# Patient Record
Sex: Female | Born: 1990 | Race: Black or African American | Hispanic: No | Marital: Single | Smoking: Current some day smoker
Health system: Southern US, Community
[De-identification: ages and names within clinical notes are randomized; demographics above are authoritative.]

## PROBLEM LIST (undated history)

## (undated) ENCOUNTER — Inpatient Hospital Stay (HOSPITAL_COMMUNITY): Payer: Self-pay

## (undated) DIAGNOSIS — Z789 Other specified health status: Secondary | ICD-10-CM

## (undated) HISTORY — PX: NO PAST SURGERIES: SHX2092

## (undated) HISTORY — PX: WISDOM TOOTH EXTRACTION: SHX21

---

## 2012-05-09 ENCOUNTER — Emergency Department (HOSPITAL_COMMUNITY)
Admission: EM | Admit: 2012-05-09 | Discharge: 2012-05-09 | Disposition: A | Discharge status: Home / Self Care | Source: Home / Self Care | Attending: Emergency Medicine | Admitting: Emergency Medicine

## 2012-05-09 ENCOUNTER — Encounter (HOSPITAL_COMMUNITY): Payer: Self-pay | Admitting: Emergency Medicine

## 2012-05-09 DIAGNOSIS — L02412 Cutaneous abscess of left axilla: Secondary | ICD-10-CM

## 2012-05-09 DIAGNOSIS — IMO0002 Reserved for concepts with insufficient information to code with codable children: Secondary | ICD-10-CM

## 2012-05-09 MED ORDER — SULFAMETHOXAZOLE-TRIMETHOPRIM 800-160 MG PO TABS: 1.0000 | ORAL_TABLET | Freq: Two times a day (BID) | ORAL | Status: DC

## 2012-05-09 NOTE — ED Provider Notes (Signed)
History     CSN: [id number]  Arrival date & time 05/09/12  1356   None     Chief Complaint  Patient presents with  . Recurrent Skin Infections    (Consider location/radiation/quality/duration/timing/severity/associated sxs/prior treatment) The history is provided by the patient.  This patient presents for evaluation of a cutaneous abscess.  The lesion is located in the left axilla.  Onset was 1 week ago.  Symptoms have worsened.  Abscess has associated symptoms of pain.  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  There is no known previous history of MRSA.   They do not have a history of diabetes. Has applied warm compresses.  Has had I and D of previous abscesses.  History reviewed. No pertinent past medical history.  History reviewed. No pertinent past surgical history.  History reviewed. No pertinent family history.  History  Substance Use Topics  . Smoking status: Not on file  . Smokeless tobacco: Not on file  . Alcohol Use: Not on file    OB History    Grav Para Term Preterm Abortions TAB SAB Ect Mult Living                  Review of Systems  Constitutional: Negative.   Respiratory: Negative.   Cardiovascular: Negative.   Musculoskeletal: Negative.   Skin: Positive for wound.    Allergies  Review of patient's allergies indicates no known allergies.  Home Medications   Current Outpatient Rx  Name Route Sig Dispense Refill  . SULFAMETHOXAZOLE-TRIMETHOPRIM 800-160 MG PO TABS Oral Take 1 tablet by mouth every 12 (twelve) hours. 20 tablet 0    BP 124/83  Pulse 77  Temp 98.1 F (36.7 C) (Oral)  Resp 18  SpO2 100%  LMP 04/19/2012  Physical Exam  Nursing note and vitals reviewed. Constitutional: She is oriented to person, place, and time. Vital signs are normal. She appears well-developed and well-nourished. She is active and cooperative.  HENT:  Head: Normocephalic.  Eyes: Conjunctivae normal are normal. Pupils are  equal, round, and reactive to light. No scleral icterus.  Neck: Trachea normal. Neck supple.  Cardiovascular: Normal rate and regular rhythm.   Pulmonary/Chest: Effort normal and breath sounds normal.  Neurological: She is alert and oriented to person, place, and time. No cranial nerve deficit or sensory deficit.  Skin: Skin is warm and dry. Lesion noted.     Psychiatric: She has a normal mood and affect. Her speech is normal and behavior is normal. Judgment and thought content normal. Cognition and memory are normal.    ED Course  Procedures (including critical care time)  Labs Reviewed - No data to display No results found.   1. Abscess of axilla, left       MDM  Warm compresses, dial soap, womens degree deodorant, shave less with new razors, take antibiotics as prescribed.  rtc in 3 days if symptoms are not improved.        Johnsie Kindred, NP 05/09/12 1458

## 2012-05-09 NOTE — ED Provider Notes (Signed)
Medical screening examination/treatment/procedure(s) were performed by non-physician practitioner and as supervising physician I was immediately available for consultation/collaboration.  Leslee Home, M.D.   Reuben Likes, MD 05/09/12 [phone]

## 2012-05-09 NOTE — ED Notes (Signed)
Pt c/o boil under left arm x 1 wk. It has become hard and very sore to touch.  Recent change in detergent.   Pt denies drainage.

## 2012-05-17 ENCOUNTER — Emergency Department (INDEPENDENT_AMBULATORY_CARE_PROVIDER_SITE_OTHER)
Admission: EM | Admit: 2012-05-17 | Discharge: 2012-05-17 | Disposition: A | Discharge status: Home / Self Care | Source: Home / Self Care

## 2012-05-17 ENCOUNTER — Encounter (HOSPITAL_COMMUNITY): Payer: Self-pay | Admitting: Emergency Medicine

## 2012-05-17 DIAGNOSIS — B373 Candidiasis of vulva and vagina: Secondary | ICD-10-CM

## 2012-05-17 LAB — POCT URINALYSIS DIP (DEVICE)
Nitrite: NEGATIVE
Protein, ur: 30 mg/dL — AB
pH: 6 (ref 5.0–8.0)

## 2012-05-17 LAB — WET PREP, GENITAL: Clue Cells Wet Prep HPF POC: NONE SEEN

## 2012-05-17 MED ORDER — FLUCONAZOLE 150 MG PO TABS: ORAL_TABLET | ORAL | Status: DC

## 2012-05-17 NOTE — ED Provider Notes (Signed)
History     CSN: [id number]  Arrival date & time 05/17/12  1736   None     Chief Complaint  Patient presents with  . Urinary Tract Infection    (Consider location/radiation/quality/duration/timing/severity/associated sxs/prior treatment) HPI Comments: -21year-old female states that she has a UTI. Specific symptoms include vaginal itching, vaginal discharge and vulvovaginal erythema. The symptoms are occurring for 24-48 hours. She was started on antibiotics one week ago to treat and abscess. When questioned the patient denies any urinary symptoms. She denies dysuria, urinary frequency or urgency.  Patient is a 21 y.o. female presenting with urinary tract infection.  Urinary Tract Infection Pertinent negatives include no chest pain and no shortness of breath.    History reviewed. No pertinent past medical history.  History reviewed. No pertinent past surgical history.  History reviewed. No pertinent family history.  History  Substance Use Topics  . Smoking status: Not on file  . Smokeless tobacco: Not on file  . Alcohol Use: Not on file    OB History    Grav Para Term Preterm Abortions TAB SAB Ect Mult Living                  Review of Systems  Constitutional: Negative for fever, activity change and fatigue.  HENT: Negative.   Respiratory: Negative for cough, shortness of breath and wheezing.   Cardiovascular: Negative for chest pain and palpitations.  Gastrointestinal: Negative.   Genitourinary: Positive for vaginal discharge and vaginal pain. Negative for dysuria, frequency, flank pain and pelvic pain.  Musculoskeletal: Negative.   Skin: Negative for color change, pallor and rash.  Neurological: Negative.     Allergies  Review of patient's allergies indicates no known allergies.  Home Medications   Current Outpatient Rx  Name Route Sig Dispense Refill  . FLUCONAZOLE 150 MG PO TABS  1 tab q o d times 3 doses 3 tablet 0  . SULFAMETHOXAZOLE-TRIMETHOPRIM  800-160 MG PO TABS Oral Take 1 tablet by mouth every 12 (twelve) hours. 20 tablet 0    BP 120/80  Pulse 72  Temp 98 F (36.7 C) (Oral)  Resp 18  SpO2 100%  LMP 04/19/2012  Physical Exam  Constitutional: She is oriented to person, place, and time. She appears well-developed and well-nourished. No distress.  HENT:  Head: Normocephalic and atraumatic.  Mouth/Throat: No oropharyngeal exudate.  Eyes: EOM are normal.  Neck: Normal range of motion. Neck supple.  Pulmonary/Chest: Effort normal and breath sounds normal. No respiratory distress.  Abdominal: Soft. There is no tenderness.  Genitourinary: Vaginal discharge found.       Right cottage cheese discharge coating the. vulvovaginal area. Cervix is pink without lesions. Os nonparous, no erythema. Above discharge coating the cervix. Neg CMT  Musculoskeletal: Normal range of motion.  Neurological: She is alert and oriented to person, place, and time. No cranial nerve deficit.  Skin: Skin is warm and dry.  Psychiatric: She has a normal mood and affect.    ED Course  Procedures (including critical care time)  Labs Reviewed  POCT URINALYSIS DIP (DEVICE) - Abnormal; Notable for the following:    Bilirubin Urine SMALL (*)     Ketones, ur TRACE (*)     Hgb urine dipstick TRACE (*)     Protein, ur 30 (*)     Leukocytes, UA SMALL (*)  Biochemical Testing Only. Please order routine urinalysis from main lab if confirmatory testing is needed.   All other components within normal limits  POCT  PREGNANCY, URINE   No results found.   1. Candida vaginitis       MDM  Diflucan 150 mg one every other day x3 doses For vulvovaginal discomfort may try Vagisil topical cold compresses locally GC/chlamydia and wet prep obtained. Results for orders placed during the hospital encounter of 05/17/12  POCT URINALYSIS DIP (DEVICE)      Component Value Range   Glucose, UA NEGATIVE  NEGATIVE mg/dL   Bilirubin Urine SMALL (*) NEGATIVE   Ketones,  ur TRACE (*) NEGATIVE mg/dL   Specific Gravity, Urine >=1.030  1.005 - 1.030   Hgb urine dipstick TRACE (*) NEGATIVE   pH 6.0  5.0 - 8.0   Protein, ur 30 (*) NEGATIVE mg/dL   Urobilinogen, UA 0.2  0.0 - 1.0 mg/dL   Nitrite NEGATIVE  NEGATIVE   Leukocytes, UA SMALL (*) NEGATIVE  POCT PREGNANCY, URINE      Component Value Range   Preg Test, Ur NEGATIVE  NEGATIVE    The small amount of leukocytes are most likely due to contamination of the vaginal discharge \       Hayden Rasmussen, NP 05/17/12 1941  Hayden Rasmussen, NP 05/17/12 1949

## 2012-05-17 NOTE — ED Notes (Signed)
Reports discharge and itching.   Patient states she did have sexual activity and notice afterwards she had redness in the area.

## 2012-05-18 LAB — GC/CHLAMYDIA PROBE AMP, GENITAL: Chlamydia, DNA Probe: NEGATIVE

## 2012-05-18 NOTE — ED Provider Notes (Signed)
Medical screening examination/treatment/procedure(s) were performed by resident physician or non-physician practitioner and as supervising physician I was immediately available for consultation/collaboration.   Barkley Bruns MD.    Linna Hoff, MD 05/18/12 [phone]

## 2012-05-19 NOTE — ED Notes (Signed)
GC/Chlamydia neg., Wet prep: mod. Yeast, mod. WBC's. Pt. adequately treated with Diflucan. Penny Cherry 05/19/2012

## 2015-01-16 ENCOUNTER — Encounter (HOSPITAL_COMMUNITY): Payer: Self-pay | Admitting: *Deleted

## 2015-01-16 ENCOUNTER — Emergency Department (HOSPITAL_COMMUNITY)
Admission: EM | Admit: 2015-01-16 | Discharge: 2015-01-16 | Disposition: A | Discharge status: Home / Self Care | Attending: Emergency Medicine | Admitting: Emergency Medicine

## 2015-01-16 DIAGNOSIS — Z720 Tobacco use: Secondary | ICD-10-CM | POA: Insufficient documentation

## 2015-01-16 DIAGNOSIS — R197 Diarrhea, unspecified: Secondary | ICD-10-CM | POA: Insufficient documentation

## 2015-01-16 DIAGNOSIS — Z3202 Encounter for pregnancy test, result negative: Secondary | ICD-10-CM | POA: Insufficient documentation

## 2015-01-16 DIAGNOSIS — R109 Unspecified abdominal pain: Secondary | ICD-10-CM | POA: Insufficient documentation

## 2015-01-16 LAB — BASIC METABOLIC PANEL
ANION GAP: 10 (ref 5–15)
BUN: 11 mg/dL (ref 6–20)
CALCIUM: 9 mg/dL (ref 8.9–10.3)
CO2: 22 mmol/L (ref 22–32)
Chloride: 103 mmol/L (ref 101–111)
Creatinine, Ser: 1.04 mg/dL — ABNORMAL HIGH (ref 0.44–1.00)
GFR calc Af Amer: >60 mL/min (ref >60)
GFR calc non Af Amer: >60 mL/min (ref >60)
GLUCOSE: 130 mg/dL — AB (ref 65–99)
Potassium: 3.3 mmol/L — ABNORMAL LOW (ref 3.5–5.1)
SODIUM: 135 mmol/L (ref 135–145)

## 2015-01-16 LAB — CBC WITH DIFFERENTIAL/PLATELET
BASOS PCT: 0 % (ref 0–1)
Basophils Absolute: 0 10*3/uL (ref 0.0–0.1)
EOS ABS: 0.1 10*3/uL (ref 0.0–0.7)
EOS PCT: 1 % (ref 0–5)
HCT: 40.8 % (ref 36.0–46.0)
HEMOGLOBIN: 13.9 g/dL (ref 12.0–15.0)
LYMPHS ABS: 0.8 10*3/uL (ref 0.7–4.0)
Lymphocytes Relative: 17 % (ref 12–46)
MCH: 30.6 pg (ref 26.0–34.0)
MCHC: 34.1 g/dL (ref 30.0–36.0)
MCV: 89.9 fL (ref 78.0–100.0)
MONOS PCT: 7 % (ref 3–12)
Monocytes Absolute: 0.4 10*3/uL (ref 0.1–1.0)
Neutro Abs: 3.5 10*3/uL (ref 1.7–7.7)
Neutrophils Relative %: 75 % (ref 43–77)
Platelets: 214 10*3/uL (ref 150–400)
RBC: 4.54 MIL/uL (ref 3.87–5.11)
RDW: 12.3 % (ref 11.5–15.5)
WBC: 4.7 10*3/uL (ref 4.0–10.5)

## 2015-01-16 LAB — I-STAT BETA HCG BLOOD, ED (MC, WL, AP ONLY): I-stat hCG, quantitative: <5 m[IU]/mL (ref <5)

## 2015-01-16 MED ORDER — DICYCLOMINE HCL 20 MG PO TABS: 20.0000 mg | ORAL_TABLET | Freq: Two times a day (BID) | ORAL | Status: DC

## 2015-01-16 MED ORDER — LOPERAMIDE HCL 2 MG PO CAPS
2.0000 mg | ORAL_CAPSULE | Freq: Once | ORAL | Status: AC
  Administered 2015-01-16: 2 mg via ORAL
  Filled 2015-01-16: qty 1

## 2015-01-16 MED ORDER — POTASSIUM CHLORIDE CRYS ER 20 MEQ PO TBCR
20.0000 meq | EXTENDED_RELEASE_TABLET | Freq: Once | ORAL | Status: AC
  Administered 2015-01-16: 20 meq via ORAL
  Filled 2015-01-16: qty 1

## 2015-01-16 MED ORDER — DICYCLOMINE HCL 10 MG PO CAPS
10.0000 mg | ORAL_CAPSULE | Freq: Once | ORAL | Status: AC
  Administered 2015-01-16: 10 mg via ORAL
  Filled 2015-01-16: qty 1

## 2015-01-16 MED ORDER — LOPERAMIDE HCL 2 MG PO CAPS: 2.0000 mg | ORAL_CAPSULE | Freq: Four times a day (QID) | ORAL | Status: DC | PRN

## 2015-01-16 NOTE — ED Notes (Signed)
Pt stated that on Sunday she began to have severe abdominal pain associated with diarrhea. Cold chills and sweats are also presents.

## 2015-01-16 NOTE — ED Notes (Signed)
NAD at this time. Pt is stable and going home.  

## 2015-01-16 NOTE — ED Provider Notes (Signed)
CSN: [id number]     Arrival date & time 01/16/15  [id number] History   First MD Initiated Contact with Patient 01/16/15 [phone]     Chief Complaint  Patient presents with  . Abdominal Pain     (Consider location/radiation/quality/duration/timing/severity/associated sxs/prior Treatment) Patient is a 24 y.o. female presenting with abdominal pain. The history is provided by the patient and medical records.  Abdominal Pain Associated symptoms: diarrhea     This is a 24 year old female with no significant past medical history presenting to the ED for abdominal pain and diarrhea. This started over the weekend and has improved since onset. States diarrhea has been watery, nonbloody. She still does have some mild lower abdominal cramping. No nausea or vomiting. She also reports chills and cold sweats. Denies fever. No history of abdominal surgeries or GI issues. No urinary symptoms or vaginal complaints. Has been able to drink ginger ale so fat today.  No intervention tried prior to arrival.  VSS.  History reviewed. No pertinent past medical history. History reviewed. No pertinent past surgical history. No family history on file. History  Substance Use Topics  . Smoking status: Light Tobacco Smoker  . Smokeless tobacco: Not on file  . Alcohol Use: 0.6 oz/week    1 Shots of liquor per week   OB History    No data available     Review of Systems  Gastrointestinal: Positive for abdominal pain and diarrhea.  All other systems reviewed and are negative.     Allergies  Review of patient's allergies indicates no known allergies.  Home Medications   Prior to Admission medications   Medication Sig Start Date End Date Taking? Authorizing Provider  fluconazole (DIFLUCAN) 150 MG tablet 1 tab q o d times 3 doses 05/17/12   Hayden Rasmussenavid Mabe, NP  sulfamethoxazole-trimethoprim (SEPTRA DS) 800-160 MG per tablet Take 1 tablet by mouth every 12 (twelve) hours. 05/09/12   Johnsie Kindredarmen L Chatten, NP   BP 114/76 mmHg   Pulse 86  Temp(Src) 98.2 F (36.8 C) (Oral)  Resp 18  Ht 5\' 3"  (1.6 m)  Wt 143 lb (64.864 kg)  BMI 25.34 kg/m2  SpO2 99%   Physical Exam  Constitutional: She is oriented to person, place, and time. She appears well-developed and well-nourished.  HENT:  Head: Normocephalic and atraumatic.  Mouth/Throat: Oropharynx is clear and moist.  Mucous membranes remain moist  Eyes: Conjunctivae and EOM are normal. Pupils are equal, round, and reactive to light.  Neck: Normal range of motion.  Cardiovascular: Normal rate, regular rhythm and normal heart sounds.   Pulmonary/Chest: Effort normal and breath sounds normal.  Abdominal: Soft. Bowel sounds are normal. There is no tenderness. There is no rigidity and no guarding.  Abdomen soft, nondistended, no focal tenderness or peritoneal signs  Musculoskeletal: Normal range of motion.  Neurological: She is alert and oriented to person, place, and time.  Skin: Skin is warm and dry.  Psychiatric: She has a normal mood and affect.  Nursing note and vitals reviewed.   ED Course  Procedures (including critical care time) Labs Review Labs Reviewed  BASIC METABOLIC PANEL - Abnormal; Notable for the following:    Potassium 3.3 (*)    Glucose, Bld 130 (*)    Creatinine, Ser 1.04 (*)    All other components within normal limits  CBC WITH DIFFERENTIAL/PLATELET  I-STAT BETA HCG BLOOD, ED ([facility], [facility], [facility] ONLY)    Imaging Review No results found.   EKG Interpretation None  MDM   Final diagnoses:  Abdominal cramping  Diarrhea   24 year old female here with diarrhea for the past 2 days. Patient afebrile, nontoxic. Her abdominal exam is benign. Her mucous membranes remain moist and she does not appear dehydrated. Labwork is reassuring, mild hypokalemia at 3.3 which was replaced here. Patient was given Imodium and Bentyl here in ED, some improvement.  No active diarrhea here.  Patient will be d/c home with supportive care.  Discussed plan with  patient, he/she acknowledged understanding and agreed with plan of care.  Return precautions given for new or worsening symptoms.  Garlon Hatchet, PA-C 01/16/15 1306  Doug Sou, MD 01/16/15 1755

## 2015-01-16 NOTE — Discharge Instructions (Signed)
Take the prescribed medication as directed to help with diarrhea and abdominal cramping. Return to the ED for new or worsening symptoms.

## 2015-01-16 NOTE — ED Provider Notes (Signed)
Complains of diarrhea and crampy abdominal pain diffuse started 2 days ago. She feels improved today over yesterday. Associated symptoms include chills and subjective fever. No treatment prior to coming here no lightheadedness no other associated symptoms and exam no distress, not ill-appearing abdomen nondistended normal active bowel sounds nontender  Doug SouSam Christiano Blandon, MD 01/16/15 1100

## 2015-03-13 ENCOUNTER — Inpatient Hospital Stay (HOSPITAL_COMMUNITY)
Admission: AD | Admit: 2015-03-13 | Discharge: 2015-03-13 | Disposition: A | Discharge status: Home / Self Care | Source: Ambulatory Visit | Attending: Obstetrics & Gynecology | Admitting: Obstetrics & Gynecology

## 2015-03-13 ENCOUNTER — Inpatient Hospital Stay (HOSPITAL_COMMUNITY)

## 2015-03-13 ENCOUNTER — Encounter (HOSPITAL_COMMUNITY): Payer: Self-pay | Admitting: *Deleted

## 2015-03-13 DIAGNOSIS — Z3A09 9 weeks gestation of pregnancy: Secondary | ICD-10-CM | POA: Insufficient documentation

## 2015-03-13 DIAGNOSIS — O200 Threatened abortion: Secondary | ICD-10-CM

## 2015-03-13 DIAGNOSIS — O0931 Supervision of pregnancy with insufficient antenatal care, first trimester: Secondary | ICD-10-CM | POA: Insufficient documentation

## 2015-03-13 DIAGNOSIS — O209 Hemorrhage in early pregnancy, unspecified: Secondary | ICD-10-CM | POA: Diagnosis present

## 2015-03-13 DIAGNOSIS — N939 Abnormal uterine and vaginal bleeding, unspecified: Secondary | ICD-10-CM

## 2015-03-13 LAB — DIFFERENTIAL
BASOS ABS: 0 10*3/uL (ref 0.0–0.1)
Basophils Relative: 1 % (ref 0–1)
EOS PCT: 3 % (ref 0–5)
Eosinophils Absolute: 0.2 10*3/uL (ref 0.0–0.7)
LYMPHS PCT: 31 % (ref 12–46)
Lymphs Abs: 1.7 10*3/uL (ref 0.7–4.0)
Monocytes Absolute: 0.3 10*3/uL (ref 0.1–1.0)
Monocytes Relative: 6 % (ref 3–12)
NEUTROS ABS: 3.3 10*3/uL (ref 1.7–7.7)
NEUTROS PCT: 59 % (ref 43–77)

## 2015-03-13 LAB — URINE MICROSCOPIC-ADD ON

## 2015-03-13 LAB — HCG, QUANTITATIVE, PREGNANCY: HCG, BETA CHAIN, QUANT, S: 34408 m[IU]/mL — AB (ref <5)

## 2015-03-13 LAB — WET PREP, GENITAL
TRICH WET PREP: NONE SEEN
WBC, Wet Prep HPF POC: NONE SEEN
YEAST WET PREP: NONE SEEN

## 2015-03-13 LAB — URINALYSIS, ROUTINE W REFLEX MICROSCOPIC
Bilirubin Urine: NEGATIVE
GLUCOSE, UA: NEGATIVE mg/dL
KETONES UR: 15 mg/dL — AB
LEUKOCYTES UA: NEGATIVE
Nitrite: NEGATIVE
PROTEIN: NEGATIVE mg/dL
Specific Gravity, Urine: >1.03 — ABNORMAL HIGH (ref 1.005–1.030)
UROBILINOGEN UA: 0.2 mg/dL (ref 0.0–1.0)
pH: 5.5 (ref 5.0–8.0)

## 2015-03-13 LAB — HEPATITIS B SURFACE ANTIGEN: HEP B S AG: NEGATIVE

## 2015-03-13 LAB — CBC
HCT: 37.5 % (ref 36.0–46.0)
Hemoglobin: 12.7 g/dL (ref 12.0–15.0)
MCH: 31.1 pg (ref 26.0–34.0)
MCHC: 33.9 g/dL (ref 30.0–36.0)
MCV: 91.9 fL (ref 78.0–100.0)
PLATELETS: 230 10*3/uL (ref 150–400)
RBC: 4.08 MIL/uL (ref 3.87–5.11)
RDW: 12.9 % (ref 11.5–15.5)
WBC: 5.6 10*3/uL (ref 4.0–10.5)

## 2015-03-13 LAB — TYPE AND SCREEN
ABO/RH(D): O POS
ANTIBODY SCREEN: NEGATIVE

## 2015-03-13 LAB — POCT PREGNANCY, URINE: Preg Test, Ur: POSITIVE — AB

## 2015-03-13 NOTE — H&P (Signed)
MAU HISTORY AND PHYSICAL  Chief Complaint:  Vaginal Bleeding   Penny Cherry is a 24 y.o.  G1P0 with IUP at [redacted]w[redacted]d by unsure LMP presents w/ [facility].  No prenatal care thus far. Has had one week of spotting, slightly more in volume the last few days. No abdominal pain or cramping. No history STD, no fever or chills, no nausea/vomiting, no breast fullness/tenderness. No increase in vaginal discharge but does cite foul smell of scant vaginal discharge. No dizziness or lightheadedness.  Menstrual History: OB History    Gravida Para Term Preterm AB TAB SAB Ectopic Multiple Living   1               Patient's last menstrual period was 01/09/2015 (approximate).      History reviewed. No pertinent past medical history.  Past Surgical History  Procedure Laterality Date  . Wisdom tooth extraction      History reviewed. No pertinent family history.  Social History  Substance Use Topics  . Smoking status: Never Smoker   . Smokeless tobacco: None  . Alcohol Use: 0.6 oz/week    1 Shots of liquor per week     No Known Allergies  No prescriptions prior to admission    Review of Systems - Negative except for what is mentioned in HPI.  Physical Exam  Blood pressure 105/56, pulse 65, temperature 97.9 F (36.6 C), temperature source Oral, resp. rate 16, height 5\' 3"  (1.6 m), weight 140 lb 6.4 oz (63.685 kg), last menstrual period 01/09/2015. GENERAL: Well-developed, well-nourished female in no acute distress.  LUNGS: Clear to auscultation bilaterally.  HEART: Regular rate and rhythm. ABDOMEN: Soft, nontender, nondistended, gravid.  EXTREMITIES: Nontender, no edema, 2+ distal pulses. GU: small amount of blood-tinged mucous extruding from closed nulliparous otherwise normal cervix, normal vagina. Cervix closed/thick/high.   Labs: Results for orders placed or performed during the hospital encounter of 03/13/15 (from the past 24 hour(s))  Urinalysis, Routine w reflex microscopic (not at  [facility])   Collection Time: 03/13/15  3:57 PM  Result Value Ref Range   Color, Urine YELLOW YELLOW   APPearance CLEAR CLEAR   Specific Gravity, Urine >1.030 (H) 1.005 - 1.030   pH 5.5 5.0 - 8.0   Glucose, UA NEGATIVE NEGATIVE mg/dL   Hgb urine dipstick LARGE (A) NEGATIVE   Bilirubin Urine NEGATIVE NEGATIVE   Ketones, ur 15 (A) NEGATIVE mg/dL   Protein, ur NEGATIVE NEGATIVE mg/dL   Urobilinogen, UA 0.2 0.0 - 1.0 mg/dL   Nitrite NEGATIVE NEGATIVE   Leukocytes, UA NEGATIVE NEGATIVE  Urine microscopic-add on   Collection Time: 03/13/15  3:57 PM  Result Value Ref Range   Squamous Epithelial / LPF MANY (A) RARE   WBC, UA 0-2 <3 WBC/hpf   RBC / HPF 0-2 <3 RBC/hpf   Bacteria, UA MANY (A) RARE   Urine-Other MUCOUS PRESENT   Pregnancy, urine POC   Collection Time: 03/13/15  4:07 PM  Result Value Ref Range   Preg Test, Ur POSITIVE (A) NEGATIVE  Wet prep, genital   Collection Time: 03/13/15  5:55 PM  Result Value Ref Range   Yeast Wet Prep HPF POC NONE SEEN NONE SEEN   Trich, Wet Prep NONE SEEN NONE SEEN   Clue Cells Wet Prep HPF POC FEW (A) NONE SEEN   WBC, Wet Prep HPF POC NONE SEEN NONE SEEN  CBC   Collection Time: 03/13/15  6:15 PM  Result Value Ref Range   WBC 5.6 4.0 - 10.5 K/uL  RBC 4.08 3.87 - 5.11 MIL/uL   Hemoglobin 12.7 12.0 - 15.0 g/dL   HCT 37.5 81.1 - 91.4 %   40.9CV 91.9 78.0 - 100.0 fL   MCH 31.1 26.0 - 34.0 pg   MCHC 33.9 30.0 - 36.0 g/dL   RDW 78.2 95.6 - 21.3 %   Platelets 230 150 - 400 K/uL  Differential   Collection Time: 03/13/15  6:15 PM  Result Value Ref Range   Neutrophils Relative % 59 43 - 77 %   Neutro Abs 3.3 1.7 - 7.7 K/uL   Lymphocytes Relative 31 12 - 46 %   Lymphs Abs 1.7 0.7 - 4.0 K/uL   Monocytes Relative 6 3 - 12 %   Monocytes Absolute 0.3 0.1 - 1.0 K/uL   Eosinophils Relative 3 0 - 5 %   Eosinophils Absolute 0.2 0.0 - 0.7 K/uL   Basophils Relative 1 0 - 1 %   Basophils Absolute 0.0 0.0 - 0.1 K/uL  hCG, quantitative, pregnancy    Collection Time: 03/13/15  6:15 PM  Result Value Ref Range   hCG, Beta Chain, Quant, S 34408 (H) <5 mIU/mL  Type and screen   Collection Time: 03/13/15  6:15 PM  Result Value Ref Range   ABO/RH(D) O POS    Antibody Screen NEG    Sample Expiration 03/16/2015     Imaging Studies:  US Ob Comp Less 14 Wks  03/13/2015   CLINICAL DATA:  Spotting for 1 week.  EXAM: OBSTETRIC <14 WK [place] AND TRANSVAGINAL OB US  TECHNIQUE: Both transabdominal and transvaginal ultrasound examinations were performed for complete evaluation of the gestation as well as the maternal uterus, adnexal regions, and pelvic cul-de-sac. Transvaginal technique was performed to assess early pregnancy.  COMPARISON:  None.  FINDINGS: Intrauterine gestational sac: Single  Yolk sac:  Yes, measures 9.8 mm  Embryo:  No  Cardiac Activity: No  Heart Rate: Not applicable  bpm  MSD: 16.2  mm   6 w   4  d  Maternal uterus/adnexae:  Subchorionic hemorrhage: Small subchorionic hemorrhage noted.  Right ovary: Normal  Left ovary: Normal  Other :None  Free fluid:  None  IMPRESSION: 1. There is a single intrauterine gestational sac which contains an enlarged yolk sac without embryo. Findings are suspicious but not yet definitive for failed pregnancy. Recommend follow-up US in 10-14 days for definitive diagnosis. This recommendation follows SRU consensus guidelines: Diagnostic Criteria for Nonviable Pregnancy Early in the First Trimester. Malva Limes Med 2013; 086:5784-69. 2. Small subchorionic hemorrhage.   Electronically Signed   By: Signa Kell M.D.   On: 03/13/2015 19:07   US Ob Transvaginal  03/13/2015   CLINICAL DATA:  Spotting for 1 week.  EXAM: OBSTETRIC <14 WK [place] AND TRANSVAGINAL OB US  TECHNIQUE: Both transabdominal and transvaginal ultrasound examinations were performed for complete evaluation of the gestation as well as the maternal uterus, adnexal regions, and pelvic cul-de-sac. Transvaginal technique was performed to assess early pregnancy.   COMPARISON:  None.  FINDINGS: Intrauterine gestational sac: Single  Yolk sac:  Yes, measures 9.8 mm  Embryo:  No  Cardiac Activity: No  Heart Rate: Not applicable  bpm  MSD: 16.2  mm   6 w   4  d  Maternal uterus/adnexae:  Subchorionic hemorrhage: Small subchorionic hemorrhage noted.  Right ovary: Normal  Left ovary: Normal  Other :None  Free fluid:  None  IMPRESSION: 1. There is a single intrauterine gestational sac which contains an  enlarged yolk sac without embryo. Findings are suspicious but not yet definitive for failed pregnancy. Recommend follow-up US in 10-14 days for definitive diagnosis. This recommendation follows SRU consensus guidelines: Diagnostic Criteria for Nonviable Pregnancy Early in the First Trimester. Malva Limes Med 2013; 960:4540-98. 2. Small subchorionic hemorrhage.   Electronically Signed   By: Signa Kell M.D.   On: 03/13/2015 19:07     MDM - first tri vaginal bleeding. Will check TVUS, type and screen (as well as rest of prenatal labs), gc/chlamydia, and wet prep.  Assessment/Plan: Penny Cherry is  24 y.o. G1P0 at [redacted]w[redacted]d presents with first trimester vaginal bleeding. No abdominal pain, no signficant bleeding. SSE shows closed cervix with small amount of blood extruding from os. Labs show no anemia, hcg quant is 35,000, blood type is O positive antibody negative. TVUS shows IUP with yolk sac but no fetus or cardiac activity. At this point either this represents an early threatened abortion or a missed abortion. We will repeat TVUS in 10 to 14 days and will refer the patient to our clinic to establish care. I have given her bleeding and infection return precautions. GC/chlamydia is pending.    Cherrie Gauze Penny Cherry 8/23/20168:50 PM

## 2015-03-13 NOTE — Discharge Instructions (Signed)
Follow-up in our clinic soon. Ideally you should schedule an appointment after your ultrasound to discuss its results. Seek medical attention if you develop significant vaginal bleeding (soaking through a pad or tampon every hour for a few hours), significant abdominal pain, fever, chills, or for any other concern.

## 2015-03-13 NOTE — MAU Note (Signed)
Spotting since last Wednesday, became heavier over the weekend, is still heavy today, passing small clots.  Two pos HPT's on 8/13.  Denies abd pain.

## 2015-03-14 ENCOUNTER — Telehealth: Payer: Self-pay | Admitting: General Practice

## 2015-03-14 DIAGNOSIS — O200 Threatened abortion: Secondary | ICD-10-CM

## 2015-03-14 LAB — URINE CULTURE
Culture: 2000
Special Requests: NORMAL

## 2015-03-14 LAB — ABO/RH: ABO/RH(D): O POS

## 2015-03-14 LAB — RUBELLA SCREEN: RUBELLA: 21 {index} (ref >0.99)

## 2015-03-14 LAB — RPR: RPR: NONREACTIVE

## 2015-03-14 LAB — HIV ANTIBODY (ROUTINE TESTING W REFLEX): HIV SCREEN 4TH GENERATION: NONREACTIVE

## 2015-03-14 NOTE — Telephone Encounter (Signed)
Per Dr Ashok Pall, patient needs follow up ultrasound between 9/2-9/6 for threatened miscarriage. Scheduled ultrasound for 9/2 @ 10am. Called patient and informed her of ultrasound date and time. Patient verbalized understanding and had no questions

## 2015-03-15 LAB — GC/CHLAMYDIA PROBE AMP (~~LOC~~) NOT AT ARMC
Chlamydia: NEGATIVE
NEISSERIA GONORRHEA: NEGATIVE

## 2015-03-23 ENCOUNTER — Ambulatory Visit (HOSPITAL_COMMUNITY)
Admission: RE | Admit: 2015-03-23 | Discharge: 2015-03-23 | Disposition: A | Discharge status: Home / Self Care | Payer: Self-pay | Source: Ambulatory Visit | Attending: Obstetrics and Gynecology | Admitting: Obstetrics and Gynecology

## 2015-03-23 ENCOUNTER — Inpatient Hospital Stay (HOSPITAL_COMMUNITY)
Admission: AD | Admit: 2015-03-23 | Discharge: 2015-03-23 | Disposition: A | Discharge status: Home / Self Care | Payer: Self-pay | Source: Ambulatory Visit | Attending: Obstetrics & Gynecology | Admitting: Obstetrics & Gynecology

## 2015-03-23 DIAGNOSIS — O021 Missed abortion: Secondary | ICD-10-CM

## 2015-03-23 DIAGNOSIS — O200 Threatened abortion: Secondary | ICD-10-CM | POA: Insufficient documentation

## 2015-03-23 MED ORDER — OXYCODONE-ACETAMINOPHEN 5-325 MG PO TABS: 1.0000 | ORAL_TABLET | Freq: Four times a day (QID) | ORAL | Status: DC | PRN

## 2015-03-23 NOTE — Discharge Instructions (Signed)
Incomplete Miscarriage A miscarriage is the sudden loss of an unborn baby (fetus) before the 20th week of pregnancy. In an incomplete miscarriage, parts of the fetus or placenta (afterbirth) remain in the body.  Having a miscarriage can be an emotional experience. Talk with your health care provider about any questions you may have about miscarrying, the grieving process, and your future pregnancy plans. CAUSES   Problems with the fetal chromosomes that make it impossible for the baby to develop normally. Problems with the baby's genes or chromosomes are most often the result of errors that occur by chance as the embryo divides and grows. The problems are not inherited from the parents.  Infection of the cervix or uterus.  Hormone problems.  Problems with the cervix, such as having an incompetent cervix. This is when the tissue in the cervix is not strong enough to hold the pregnancy.  Problems with the uterus, such as an abnormally shaped uterus, uterine fibroids, or congenital abnormalities.  Certain medical conditions.  Smoking, drinking alcohol, or taking illegal drugs.  Trauma. SYMPTOMS   Vaginal bleeding or spotting, with or without cramps or pain.  Pain or cramping in the abdomen or lower back.  Passing fluid, tissue, or blood clots from the vagina. DIAGNOSIS  Your health care provider will perform a physical exam. You may also have an ultrasound to confirm the miscarriage. Blood or urine tests may also be ordered. TREATMENT   Usually, a dilation and curettage (D&C) procedure is performed. During a D&C procedure, the cervix is widened (dilated) and any remaining fetal or placental tissue is gently removed from the uterus.  Antibiotic medicines are prescribed if there is an infection. Other medicines may be given to reduce the size of the uterus (contract) if there is a lot of bleeding.  If you have Rh negative blood and your baby was Rh positive, you will need a Rho (D)  immune globulin shot. This shot will protect any future baby from having Rh blood problems in future pregnancies.  You may be confined to bed rest. This means you should stay in bed and only get up to use the bathroom. HOME CARE INSTRUCTIONS   Rest as directed by your health care provider.  Restrict activity as directed by your health care provider. You may be allowed to continue light activity if curettage was not done but you require further treatment.  Keep track of the number of pads you use each day. Keep track of how soaked (saturated) they are. Record this information.  Do not  use tampons.  Do not douche or have sexual intercourse until approved by your health care provider.  Keep all follow-up appointments for reevaluation and continuing management.  Only take over-the-counter or prescription medicines for pain, fever, or discomfort as directed by your health care provider.  Take antibiotic medicine as directed by your health care provider. Make sure you finish it even if you start to feel better. SEEK IMMEDIATE MEDICAL CARE IF:   You experience severe cramps in your stomach, back, or abdomen.  You have an unexplained temperature (make sure to record these temperatures).  You pass large clots or tissue (save these for your health care provider to inspect).  Your bleeding increases.  You become light-headed, weak, or have fainting episodes. MAKE SURE YOU:   Understand these instructions.  Will watch your condition.  Will get help right away if you are not doing well or get worse. Document Released: 07/07/2005 Document Revised: 11/21/2013 Document Reviewed:   02/03/2013 Exit[employer] Patient Information 2015 [employer]. This information is not intended to replace advice given to you by your health care provider. Make sure you discuss any questions you have with your health care provider.  

## 2015-03-23 NOTE — MAU Provider Note (Signed)
History     CSN: [id number]  Arrival date and time: 03/23/15 1040   First Provider Initiated Contact with Patient 03/23/15 1159      No chief complaint on file.  HPI  This is a 24 y.o. female at [redacted]w[redacted]d by LMP who is here for followup Ultrasound. She was seen about 10 days ago for vaginal bleeding. Her HCG level was 34,408 and US showed a single sac with yolk sac but no fetus. Has had one day with heavy bleeding and strong cramping, but it stopped.   ED Note from 03/13/15: Penny Cherry is 24 y.o. G1P0 at [redacted]w[redacted]d presents with first trimester vaginal bleeding. No abdominal pain, no signficant bleeding. SSE shows closed cervix with small amount of blood extruding from os. Labs show no anemia, hcg quant is 35,000, blood type is O positive antibody negative. TVUS shows IUP with yolk sac but no fetus or cardiac activity. At this point either this represents an early threatened abortion or a missed abortion. We will repeat TVUS in 10 to 14 days and will refer the patient to our clinic to establish care. I have given her bleeding and infection return precautions. GC/chlamydia is pending.   OB History    Gravida Para Term Preterm AB TAB SAB Ectopic Multiple Living   1               No past medical history on file.  Past Surgical History  Procedure Laterality Date  . Wisdom tooth extraction      No family history on file.  Social History  Substance Use Topics  . Smoking status: Never Smoker   . Smokeless tobacco: Not on file  . Alcohol Use: 0.6 oz/week    1 Shots of liquor per week    Allergies: No Known Allergies  Prescriptions prior to admission  Medication Sig Dispense Refill Last Dose  . Prenatal Vit-Min-FA-Fish Oil ([facility] PRENATAL GUMMY PO) Take 2 tablets by mouth daily.   03/13/2015 at Unknown time   Medical, Surgical, Family and Social histories reviewed and are listed above.  Medications and allergies reviewed.   Review of Systems  Constitutional: Negative for fever,  chills and malaise/fatigue.  Gastrointestinal: Negative for nausea, vomiting, abdominal pain, diarrhea and constipation.  Genitourinary: Negative for dysuria.  Musculoskeletal: Negative for back pain.   Physical Exam   Last menstrual period 01/09/2015.  Physical Exam  Constitutional: She is oriented to person, place, and time. She appears well-developed and well-nourished.  Cardiovascular: Normal rate.   Respiratory: Effort normal. No respiratory distress.  Genitourinary:  Exam not indicated  Musculoskeletal: Normal range of motion.  Neurological: She is alert and oriented to person, place, and time.  Skin: Skin is warm and dry.  Psychiatric: She has a normal mood and affect.    MAU Course  Procedures  MDM US Ob Comp Less 14 Wks  03/13/2015   CLINICAL DATA:  Spotting for 1 week.  EXAM: OBSTETRIC <14 WK [place] AND TRANSVAGINAL OB US  TECHNIQUE: Both transabdominal and transvaginal ultrasound examinations were performed for complete evaluation of the gestation as well as the maternal uterus, adnexal regions, and pelvic cul-de-sac. Transvaginal technique was performed to assess early pregnancy.  COMPARISON:  None.  FINDINGS: Intrauterine gestational sac: Single  Yolk sac:  Yes, measures 9.8 mm  Embryo:  No  Cardiac Activity: No  Heart Rate: Not applicable  bpm  MSD: 16.2  mm   6 w   4  d  Maternal uterus/adnexae:  Subchorionic  hemorrhage: Small subchorionic hemorrhage noted.  Right ovary: Normal  Left ovary: Normal  Other :None  Free fluid:  None  IMPRESSION: 1. There is a single intrauterine gestational sac which contains an enlarged yolk sac without embryo. Findings are suspicious but not yet definitive for failed pregnancy. Recommend follow-up US in 10-14 days for definitive diagnosis. This recommendation follows SRU consensus guidelines: Diagnostic Criteria for Nonviable Pregnancy Early in the First Trimester. Malva Limes Med 2013; 161:0960-45. 2. Small subchorionic hemorrhage.   Electronically  Signed   By: Signa Kell M.D.   On: 03/13/2015 19:07   US Ob Transvaginal  03/23/2015   CLINICAL DATA:  Threatened miscarriage. Vaginal bleeding. Follow-up ultrasound.  EXAM: TRANSVAGINAL OB ULTRASOUND  TECHNIQUE: Transvaginal ultrasound was performed for complete evaluation of the gestation as well as the maternal uterus, adnexal regions, and pelvic cul-de-sac.  COMPARISON:  03/13/2015.  FINDINGS: Intrauterine gestational sac: A single intrauterine gestational sac is present. The gestational sac is now a elongated and extends into the lower uterine segment.  Yolk sac:  No longer visible.  Embryo: A focal echogenic structure is measured at 3.3 mm. This was not present previously. It could represent a small fetal pole versus debris.  Cardiac Activity: None present  CRL:   3.3  mm   6 w 0 d  Maternal uterus/adnexae: A moderate-sized subchorionic hemorrhage is noted. This has increased since the prior exam.  IMPRESSION: 1. Distortion of the previously-seen intrauterine gestational sac with extension into the lower uterine segment. 2. The previously noted yolk sac is no longer visible. 3. Fetal pole versus debris measures 3.3 mm. This would correspond with a 6 week 0 day fetus. No heartbeat is visible. Findings are suspicious but not yet definitive for failed pregnancy. Recommend follow-up US in 10-14 days for definitive diagnosis. This recommendation follows SRU consensus guidelines: Diagnostic Criteria for Nonviable Pregnancy Early in the First Trimester. Malva Limes Med 2013; 409:8119-14.   Electronically Signed   By: Marin Roberts M.D.   On: 03/23/2015 11:21   US Ob Transvaginal  03/13/2015   CLINICAL DATA:  Spotting for 1 week.  EXAM: OBSTETRIC <14 WK [place] AND TRANSVAGINAL OB US  TECHNIQUE: Both transabdominal and transvaginal ultrasound examinations were performed for complete evaluation of the gestation as well as the maternal uterus, adnexal regions, and pelvic cul-de-sac. Transvaginal technique was  performed to assess early pregnancy.  COMPARISON:  None.  FINDINGS: Intrauterine gestational sac: Single  Yolk sac:  Yes, measures 9.8 mm  Embryo:  No  Cardiac Activity: No  Heart Rate: Not applicable  bpm  MSD: 16.2  mm   6 w   4  d  Maternal uterus/adnexae:  Subchorionic hemorrhage: Small subchorionic hemorrhage noted.  Right ovary: Normal  Left ovary: Normal  Other :None  Free fluid:  None  IMPRESSION: 1. There is a single intrauterine gestational sac which contains an enlarged yolk sac without embryo. Findings are suspicious but not yet definitive for failed pregnancy. Recommend follow-up US in 10-14 days for definitive diagnosis. This recommendation follows SRU consensus guidelines: Diagnostic Criteria for Nonviable Pregnancy Early in the First Trimester. Malva Limes Med 2013; 782:9562-13. 2. Small subchorionic hemorrhage.   Electronically Signed   By: Signa Kell M.D.   On: 03/13/2015 19:07   Discussed with Dr Debroah Loop results of [place] and reviewed past [place]. He feels this represents a failed pregnancy due to change and location of gestational sac, as well as disappearance of yolk sac and no  progression of fetal heartbeat.   Assessment and Plan  A:  SIUP at [redacted]w[redacted]d by LMP, 6 weeks by measurement       Missed abortion/incomplete abortion  P:  Discussed results with patient, who was thinking this was a failed pregnancy       Offered options of D&C, Cytotec, or expectant management. She prefers expectant management for now        Rx Percocet for pain management when miscarriage occurs        Bleeding precautions reviewed        Clinic will call her with followup APPT in 2-4 weeks  [facility] 03/23/2015, 11:59 AM

## 2015-04-11 ENCOUNTER — Ambulatory Visit (INDEPENDENT_AMBULATORY_CARE_PROVIDER_SITE_OTHER): Payer: Self-pay | Admitting: Obstetrics & Gynecology

## 2015-04-11 ENCOUNTER — Encounter: Payer: Self-pay | Admitting: Obstetrics & Gynecology

## 2015-04-11 VITALS — BP 120/73 | HR 54 | Temp 97.9°F | Ht 63.0 in | Wt 140.2 lb

## 2015-04-11 DIAGNOSIS — N898 Other specified noninflammatory disorders of vagina: Secondary | ICD-10-CM

## 2015-04-11 DIAGNOSIS — O039 Complete or unspecified spontaneous abortion without complication: Secondary | ICD-10-CM

## 2015-04-11 NOTE — Progress Notes (Signed)
   Subjective:    Patient ID: Penny Cherry, female    DOB: [birth year], 24 y.o.   MRN: [id number]  HPI  24 yo S AA [id number] is here 2 weeks after a miscarriage diagnosed in the MAU. She complains of some vaginal irritation. She is having sex, not using contraception, although she says that she does not want to be pregnant. She says that she plans to use condoms.   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 No blood seen in vault Discharge appears normal.      Assessment & Plan:  Vaginal irritation- check wet prep Follow up after miscarriage- check QBHCG I discussed Gardasil but she declines

## 2015-04-12 LAB — WET PREP, GENITAL
Trich, Wet Prep: NONE SEEN
Yeast Wet Prep HPF POC: NONE SEEN

## 2015-04-12 LAB — HCG, QUANTITATIVE, PREGNANCY: hCG, Beta Chain, Quant, S: 44.3 m[IU]/mL

## 2015-05-28 ENCOUNTER — Telehealth: Payer: Self-pay

## 2015-05-28 MED ORDER — METRONIDAZOLE 500 MG PO TABS: 500.0000 mg | ORAL_TABLET | Freq: Two times a day (BID) | ORAL | Status: DC

## 2015-05-28 NOTE — Telephone Encounter (Signed)
Per Dr. Marice Potterove pt has BV and tx sent to pharmacy.   Called pt and LM [facility] [address] And to please call the Clinics concerning results.

## 2015-05-29 NOTE — Telephone Encounter (Signed)
Patient called and left message stating she is returning our call. Patient states she is at work until 330pm so please call back after that.

## 2015-05-29 NOTE — Telephone Encounter (Signed)
Called patient, no answer- left message stating we are trying to reach you with results, please call us back at the clinics 

## 2015-05-29 NOTE — Telephone Encounter (Signed)
Called pt and she stated that she is no longer having the discharge so she did not feel the need to take antibiotic treatment.  I informed pt that it is her choice and if she can call us if she has other questions.

## 2015-07-13 ENCOUNTER — Inpatient Hospital Stay (HOSPITAL_COMMUNITY)
Admission: AD | Admit: 2015-07-13 | Discharge: 2015-07-13 | Disposition: A | Discharge status: Home / Self Care | Payer: Medicaid Other | Source: Ambulatory Visit | Attending: Family Medicine | Admitting: Family Medicine

## 2015-07-13 ENCOUNTER — Encounter (HOSPITAL_COMMUNITY): Payer: Self-pay | Admitting: *Deleted

## 2015-07-13 DIAGNOSIS — Z3201 Encounter for pregnancy test, result positive: Secondary | ICD-10-CM | POA: Diagnosis present

## 2015-07-13 DIAGNOSIS — N926 Irregular menstruation, unspecified: Secondary | ICD-10-CM

## 2015-07-13 HISTORY — DX: Other specified health status: Z78.9

## 2015-07-13 LAB — POCT PREGNANCY, URINE: PREG TEST UR: POSITIVE — AB

## 2015-07-13 NOTE — Discharge Instructions (Signed)
First Trimester of Pregnancy The first trimester of pregnancy is from week 1 until the end of week 12 (months 1 through 3). A week after a sperm fertilizes an egg, the egg will implant on the wall of the uterus. This embryo will begin to develop into a baby. Genes from you and your partner are forming the baby. The female genes determine whether the baby is a boy or a girl. At 6-8 weeks, the eyes and face are formed, and the heartbeat can be seen on ultrasound. At the end of 12 weeks, all the baby's organs are formed.  Now that you are pregnant, you will want to do everything you can to have a healthy baby. Two of the most important things are to get good prenatal care and to follow your health care provider's instructions. Prenatal care is all the medical care you receive before the baby's birth. This care will help prevent, find, and treat any problems during the pregnancy and childbirth. BODY CHANGES Your body goes through many changes during pregnancy. The changes vary from woman to woman.   You may gain or lose a couple of pounds at first.  You may feel sick to your stomach (nauseous) and throw up (vomit). If the vomiting is uncontrollable, call your health care provider.  You may tire easily.  You may develop headaches that can be relieved by medicines approved by your health care provider.  You may urinate more often. Painful urination may mean you have a bladder infection.  You may develop heartburn as a result of your pregnancy.  You may develop constipation because certain hormones are causing the muscles that push waste through your intestines to slow down.  You may develop hemorrhoids or swollen, bulging veins (varicose veins).  Your breasts may begin to grow larger and become tender. Your nipples may stick out more, and the tissue that surrounds them (areola) may become darker.  Your gums may bleed and may be sensitive to brushing and flossing.  Dark spots or blotches  (chloasma, mask of pregnancy) may develop on your face. This will likely fade after the baby is born.  Your menstrual periods will stop.  You may have a loss of appetite.  You may develop cravings for certain kinds of food.  You may have changes in your emotions from day to day, such as being excited to be pregnant or being concerned that something may go wrong with the pregnancy and baby.  You may have more vivid and strange dreams.  You may have changes in your hair. These can include thickening of your hair, rapid growth, and changes in texture. Some women also have hair loss during or after pregnancy, or hair that feels dry or thin. Your hair will most likely return to normal after your baby is born. WHAT TO EXPECT AT YOUR PRENATAL VISITS During a routine prenatal visit:  You will be weighed to make sure you and the baby are growing normally.  Your blood pressure will be taken.  Your abdomen will be measured to track your baby's growth.  The fetal heartbeat will be listened to starting around week 10 or 12 of your pregnancy.  Test results from any previous visits will be discussed. Your health care provider may ask you:  How you are feeling.  If you are feeling the baby move.  If you have had any abnormal symptoms, such as leaking fluid, bleeding, severe headaches, or abdominal cramping.  If you are using any tobacco products,   including cigarettes, chewing tobacco, and electronic cigarettes.  If you have any questions. Other tests that may be performed during your first trimester include:  Blood tests to find your blood type and to check for the presence of any previous infections. They will also be used to check for low iron levels (anemia) and Rh antibodies. Later in the pregnancy, blood tests for diabetes will be done along with other tests if problems develop.  Urine tests to check for infections, diabetes, or protein in the urine.  An ultrasound to confirm the  proper growth and development of the baby.  An amniocentesis to check for possible genetic problems.  Fetal screens for spina bifida and Down syndrome.  You may need other tests to make sure you and the baby are doing well.  HIV (human immunodeficiency virus) testing. Routine prenatal testing includes screening for HIV, unless you choose not to have this test. HOME CARE INSTRUCTIONS  Medicines  Follow your health care provider's instructions regarding medicine use. Specific medicines may be either safe or unsafe to take during pregnancy.  Take your prenatal vitamins as directed.  If you develop constipation, try taking a stool softener if your health care provider approves. Diet  Eat regular, well-balanced meals. Choose a variety of foods, such as meat or vegetable-based protein, fish, milk and low-fat dairy products, vegetables, fruits, and whole grain breads and cereals. Your health care provider will help you determine the amount of weight gain that is right for you.  Avoid raw meat and uncooked cheese. These carry germs that can cause birth defects in the baby.  Eating four or five small meals rather than three large meals a day may help relieve nausea and vomiting. If you start to feel nauseous, eating a few soda crackers can be helpful. Drinking liquids between meals instead of during meals also seems to help nausea and vomiting.  If you develop constipation, eat more high-fiber foods, such as fresh vegetables or fruit and whole grains. Drink enough fluids to keep your urine clear or pale yellow. Activity and Exercise  Exercise only as directed by your health care provider. Exercising will help you:  Control your weight.  Stay in shape.  Be prepared for labor and delivery.  Experiencing pain or cramping in the lower abdomen or low back is a good sign that you should stop exercising. Check with your health care provider before continuing normal exercises.  Try to avoid  standing for long periods of time. Move your legs often if you must stand in one place for a long time.  Avoid heavy lifting.  Wear low-heeled shoes, and practice good posture.  You may continue to have sex unless your health care provider directs you otherwise. Relief of Pain or Discomfort  Wear a good support bra for breast tenderness.   Take warm sitz baths to soothe any pain or discomfort caused by hemorrhoids. Use hemorrhoid cream if your health care provider approves.   Rest with your legs elevated if you have leg cramps or low back pain.  If you develop varicose veins in your legs, wear support hose. Elevate your feet for 15 minutes, 3-4 times a day. Limit salt in your diet. Prenatal Care  Schedule your prenatal visits by the twelfth week of pregnancy. They are usually scheduled monthly at first, then more often in the last 2 months before delivery.  Write down your questions. Take them to your prenatal visits.  Keep all your prenatal visits as directed by your   health care provider. Safety  Wear your seat belt at all times when driving.  Make a list of emergency phone numbers, including numbers for family, friends, the hospital, and police and fire departments. General Tips  Ask your health care provider for a referral to a local prenatal education class. Begin classes no later than at the beginning of month 6 of your pregnancy.  Ask for help if you have counseling or nutritional needs during pregnancy. Your health care provider can offer advice or refer you to specialists for help with various needs.  Do not use hot tubs, steam rooms, or saunas.  Do not douche or use tampons or scented sanitary pads.  Do not cross your legs for long periods of time.  Avoid cat litter boxes and soil used by cats. These carry germs that can cause birth defects in the baby and possibly loss of the fetus by miscarriage or stillbirth.  Avoid all smoking, herbs, alcohol, and medicines  not prescribed by your health care provider. Chemicals in these affect the formation and growth of the baby.  Do not use any tobacco products, including cigarettes, chewing tobacco, and electronic cigarettes. If you need help quitting, ask your health care provider. You may receive counseling support and other resources to help you quit.  Schedule a dentist appointment. At home, brush your teeth with a soft toothbrush and be gentle when you floss. SEEK MEDICAL CARE IF:   You have dizziness.  You have mild pelvic cramps, pelvic pressure, or nagging pain in the abdominal area.  You have persistent nausea, vomiting, or diarrhea.  You have a bad smelling vaginal discharge.  You have pain with urination.  You notice increased swelling in your face, hands, legs, or ankles. SEEK IMMEDIATE MEDICAL CARE IF:   You have a fever.  You are leaking fluid from your vagina.  You have spotting or bleeding from your vagina.  You have severe abdominal cramping or pain.  You have rapid weight gain or loss.  You vomit blood or material that looks like coffee grounds.  You are exposed to German measles and have never had them.  You are exposed to fifth disease or chickenpox.  You develop a severe headache.  You have shortness of breath.  You have any kind of trauma, such as from a fall or a car accident.   This information is not intended to replace advice given to you by your health care provider. Make sure you discuss any questions you have with your health care provider.   Document Released: 07/01/2001 Document Revised: 07/28/2014 Document Reviewed: 05/17/2013 Elsevier Interactive Patient Education 2016 [employer]  Safe Medications in Pregnancy   Acne: Benzoyl Peroxide Salicylic Acid  Backache/Headache: Tylenol: 2 regular strength every 4 hours OR              2 Extra strength every 6 hours  Colds/Coughs/Allergies: Benadryl (alcohol free) 25 mg every 6 hours as  needed Breath right strips Claritin Cepacol throat lozenges Chloraseptic throat spray Cold-Eeze- up to three times per day Cough drops, alcohol free Flonase (by prescription only) Guaifenesin Mucinex Robitussin DM (plain only, alcohol free) Saline nasal spray/drops Sudafed (pseudoephedrine) & Actifed ** use only after [redacted] weeks gestation and if you do not have high blood pressure Tylenol Vicks Vaporub Zinc lozenges Zyrtec   Constipation: Colace Ducolax suppositories Fleet enema Glycerin suppositories Metamucil Milk of magnesia Miralax Senokot Smooth move tea  Diarrhea: Kaopectate Imodium A-D  *NO pepto Bismol  Hemorrhoids: Anusol Anusol   HC Preparation H Tucks  Indigestion: Tums Maalox Mylanta Zantac  Pepcid  Insomnia: Benadryl (alcohol free) 25mg every 6 hours as needed Tylenol PM Unisom, no Gelcaps  Leg Cramps: Tums MagGel  Nausea/Vomiting:  Bonine Dramamine Emetrol Ginger extract Sea bands Meclizine  Nausea medication to take during pregnancy:  Unisom (doxylamine succinate 25 mg tablets) Take one tablet daily at bedtime. If symptoms are not adequately controlled, the dose can be increased to a maximum recommended dose of two tablets daily (1/2 tablet in the morning, 1/2 tablet mid-afternoon and one at bedtime). Vitamin B6 100mg tablets. Take one tablet twice a day (up to 200 mg per day).  Skin Rashes: Aveeno products Benadryl cream or 25mg every 6 hours as needed Calamine Lotion 1% cortisone cream  Yeast infection: Gyne-lotrimin 7 Monistat 7   **If taking multiple medications, please check labels to avoid duplicating the same active ingredients **take medication as directed on the label ** Do not exceed 4000 mg of tylenol in 24 hours **Do not take medications that contain aspirin or ibuprofen    

## 2015-07-13 NOTE — MAU Note (Signed)
Pt had a miscarriage in September.  Pos HPT last Thursday.  Pt C/O unbearable HA's, is very drowsy.  Denies abd pain or bleeding.  Wants pregnancy verification.

## 2015-07-13 NOTE — MAU Provider Note (Signed)
  S:  Emilio AspenBritney Heiser is a [age] y.o. G2P0 at [id number] wks here for confirmation of pregnancy.  Patient's Patient's last menstrual period was 05/26/2015 (approximate)..  Denies any vaginal bleeding or abdominal pain.  Plans to get prenatal care at undecided.  O:  History reviewed. No pertinent past medical history.  No family history on file.   Filed Vitals:   07/13/15 1647  BP: 110/65  Pulse: 82  Temp: 98.5 F (36.9 C)  Resp: 18   General:  A&OX3 with no signs of acute distress. She appears well-developed and well-nourished. No distress.  Neck: Normal range of motion.  Pulmonary/Chest: Effort normal. No respiratory distress.  Musculoskeletal: Normal range of motion.  Neurological: She is alert and oriented to person, place, and time.  Skin: Skin is warm and dry.   A: Positive Pregnancy Test  P: Explained benefits of taking prenatal vitamins w/folic acid early in pregnancy. Begin prenatal care. Reviewed warning signs of pregnancy.  Judeth HornErin Hayze Gazda, NP

## 2015-08-15 ENCOUNTER — Encounter: Payer: Self-pay | Admitting: Obstetrics & Gynecology

## 2015-08-15 ENCOUNTER — Ambulatory Visit (INDEPENDENT_AMBULATORY_CARE_PROVIDER_SITE_OTHER): Payer: Medicaid Other | Admitting: Student

## 2015-08-15 ENCOUNTER — Encounter: Payer: Self-pay | Admitting: Student

## 2015-08-15 ENCOUNTER — Other Ambulatory Visit (HOSPITAL_COMMUNITY)
Admission: RE | Admit: 2015-08-15 | Discharge: 2015-08-15 | Disposition: A | Discharge status: Home / Self Care | Payer: Medicaid Other | Source: Ambulatory Visit | Attending: Student | Admitting: Student

## 2015-08-15 VITALS — BP 104/66 | HR 78 | Wt 140.9 lb

## 2015-08-15 DIAGNOSIS — Z3481 Encounter for supervision of other normal pregnancy, first trimester: Secondary | ICD-10-CM | POA: Diagnosis present

## 2015-08-15 DIAGNOSIS — Z113 Encounter for screening for infections with a predominantly sexual mode of transmission: Secondary | ICD-10-CM

## 2015-08-15 DIAGNOSIS — Z3491 Encounter for supervision of normal pregnancy, unspecified, first trimester: Secondary | ICD-10-CM | POA: Insufficient documentation

## 2015-08-15 DIAGNOSIS — Z01419 Encounter for gynecological examination (general) (routine) without abnormal findings: Secondary | ICD-10-CM | POA: Diagnosis not present

## 2015-08-15 DIAGNOSIS — Z124 Encounter for screening for malignant neoplasm of cervix: Secondary | ICD-10-CM

## 2015-08-15 DIAGNOSIS — O219 Vomiting of pregnancy, unspecified: Secondary | ICD-10-CM | POA: Diagnosis not present

## 2015-08-15 LAB — POCT URINALYSIS DIP (DEVICE)
BILIRUBIN URINE: NEGATIVE
GLUCOSE, UA: 100 mg/dL — AB
Hgb urine dipstick: NEGATIVE
KETONES UR: NEGATIVE mg/dL
LEUKOCYTES UA: NEGATIVE
Nitrite: NEGATIVE
Protein, ur: 30 mg/dL — AB
SPECIFIC GRAVITY, URINE: 1.025 (ref 1.005–1.030)
Urobilinogen, UA: 1 mg/dL (ref 0.0–1.0)
pH: 7 (ref 5.0–8.0)

## 2015-08-15 MED ORDER — PROMETHAZINE HCL 25 MG PO TABS: 25.0000 mg | ORAL_TABLET | Freq: Four times a day (QID) | ORAL | Status: AC | PRN

## 2015-08-15 MED ORDER — ONDANSETRON 8 MG PO TBDP: 8.0000 mg | ORAL_TABLET | Freq: Three times a day (TID) | ORAL | Status: AC | PRN

## 2015-08-15 NOTE — Progress Notes (Signed)
Pt has miscarriage in September. Her last period started the beginning of November around the 5th.  Pt states that she is always cold and feels like something is caught in her throat. She also has headaches at times.

## 2015-08-15 NOTE — Patient Instructions (Signed)
Second Trimester of Pregnancy The second trimester is from week 13 through week 28, months 4 through 6. The second trimester is often a time when you feel your best. Your body has also adjusted to being pregnant, and you begin to feel better physically. Usually, morning sickness has lessened or quit completely, you may have more energy, and you may have an increase in appetite. The second trimester is also a time when the fetus is growing rapidly. At the end of the sixth month, the fetus is about 9 inches long and weighs about 1 pounds. You will likely begin to feel the baby move (quickening) between 18 and 20 weeks of the pregnancy. BODY CHANGES Your body goes through many changes during pregnancy. The changes vary from woman to woman.   Your weight will continue to increase. You will notice your lower abdomen bulging out.  You may begin to get stretch marks on your hips, abdomen, and breasts.  You may develop headaches that can be relieved by medicines approved by your health care provider.  You may urinate more often because the fetus is pressing on your bladder.  You may develop or continue to have heartburn as a result of your pregnancy.  You may develop constipation because certain hormones are causing the muscles that push waste through your intestines to slow down.  You may develop hemorrhoids or swollen, bulging veins (varicose veins).  You may have back pain because of the weight gain and pregnancy hormones relaxing your joints between the bones in your pelvis and as a result of a shift in weight and the muscles that support your balance.  Your breasts will continue to grow and be tender.  Your gums may bleed and may be sensitive to brushing and flossing.  Dark spots or blotches (chloasma, mask of pregnancy) may develop on your face. This will likely fade after the baby is born.  A dark line from your belly button to the pubic area (linea nigra) may appear. This will likely fade  after the baby is born.  You may have changes in your hair. These can include thickening of your hair, rapid growth, and changes in texture. Some women also have hair loss during or after pregnancy, or hair that feels dry or thin. Your hair will most likely return to normal after your baby is born. WHAT TO EXPECT AT YOUR PRENATAL VISITS During a routine prenatal visit:  You will be weighed to make sure you and the fetus are growing normally.  Your blood pressure will be taken.  Your abdomen will be measured to track your baby's growth.  The fetal heartbeat will be listened to.  Any test results from the previous visit will be discussed. Your health care provider may ask you:  How you are feeling.  If you are feeling the baby move.  If you have had any abnormal symptoms, such as leaking fluid, bleeding, severe headaches, or abdominal cramping.  If you are using any tobacco products, including cigarettes, chewing tobacco, and electronic cigarettes.  If you have any questions. Other tests that may be performed during your second trimester include:  Blood tests that check for:  Low iron levels (anemia).  Gestational diabetes (between 24 and 28 weeks).  Rh antibodies.  Urine tests to check for infections, diabetes, or protein in the urine.  An ultrasound to confirm the proper growth and development of the baby.  An amniocentesis to check for possible genetic problems.  Fetal screens for spina bifida   and Down syndrome.  HIV (human immunodeficiency virus) testing. Routine prenatal testing includes screening for HIV, unless you choose not to have this test. HOME CARE INSTRUCTIONS   Avoid all smoking, herbs, alcohol, and unprescribed drugs. These chemicals affect the formation and growth of the baby.  Do not use any tobacco products, including cigarettes, chewing tobacco, and electronic cigarettes. If you need help quitting, ask your health care provider. You may receive  counseling support and other resources to help you quit.  Follow your health care provider's instructions regarding medicine use. There are medicines that are either safe or unsafe to take during pregnancy.  Exercise only as directed by your health care provider. Experiencing uterine cramps is a good sign to stop exercising.  Continue to eat regular, healthy meals.  Wear a good support bra for breast tenderness.  Do not use hot tubs, steam rooms, or saunas.  Wear your seat belt at all times when driving.  Avoid raw meat, uncooked cheese, cat litter boxes, and soil used by cats. These carry germs that can cause birth defects in the baby.  Take your prenatal vitamins.  Take 1500-2000 mg of calcium daily starting at the 20th week of pregnancy until you deliver your baby.  Try taking a stool softener (if your health care provider approves) if you develop constipation. Eat more high-fiber foods, such as fresh vegetables or fruit and whole grains. Drink plenty of fluids to keep your urine clear or pale yellow.  Take warm sitz baths to soothe any pain or discomfort caused by hemorrhoids. Use hemorrhoid cream if your health care provider approves.  If you develop varicose veins, wear support hose. Elevate your feet for 15 minutes, 3-4 times a day. Limit salt in your diet.  Avoid heavy lifting, wear low heel shoes, and practice good posture.  Rest with your legs elevated if you have leg cramps or low back pain.  Visit your dentist if you have not gone yet during your pregnancy. Use a soft toothbrush to brush your teeth and be gentle when you floss.  A sexual relationship may be continued unless your health care provider directs you otherwise.  Continue to go to all your prenatal visits as directed by your health care provider. SEEK MEDICAL CARE IF:   You have dizziness.  You have mild pelvic cramps, pelvic pressure, or nagging pain in the abdominal area.  You have persistent nausea,  vomiting, or diarrhea.  You have a bad smelling vaginal discharge.  You have pain with urination. SEEK IMMEDIATE MEDICAL CARE IF:   You have a fever.  You are leaking fluid from your vagina.  You have spotting or bleeding from your vagina.  You have severe abdominal cramping or pain.  You have rapid weight gain or loss.  You have shortness of breath with chest pain.  You notice sudden or extreme swelling of your face, hands, ankles, feet, or legs.  You have not felt your baby move in over an hour.  You have severe headaches that do not go away with medicine.  You have vision changes.   This information is not intended to replace advice given to you by your health care provider. Make sure you discuss any questions you have with your health care provider.   Document Released: 07/01/2001 Document Revised: 07/28/2014 Document Reviewed: 09/07/2012 Elsevier Interactive Patient Education 2016 [employer]   Safe Medications in Pregnancy   Acne: Benzoyl Peroxide Salicylic Acid  Backache/Headache: Tylenol: 2 regular strength every 4 hours   OR              2 Extra strength every 6 hours  Colds/Coughs/Allergies: Benadryl (alcohol free) 25 mg every 6 hours as needed Breath right strips Claritin Cepacol throat lozenges Chloraseptic throat spray Cold-Eeze- up to three times per day Cough drops, alcohol free Flonase (by prescription only) Guaifenesin Mucinex Robitussin DM (plain only, alcohol free) Saline nasal spray/drops Sudafed (pseudoephedrine) & Actifed ** use only after [redacted] weeks gestation and if you do not have high blood pressure Tylenol Vicks Vaporub Zinc lozenges Zyrtec   Constipation: Colace Ducolax suppositories Fleet enema Glycerin suppositories Metamucil Milk of magnesia Miralax Senokot Smooth move tea  Diarrhea: Kaopectate Imodium A-D  *NO pepto Bismol  Hemorrhoids: Anusol Anusol HC Preparation  H Tucks  Indigestion: Tums Maalox Mylanta Zantac  Pepcid  Insomnia: Benadryl (alcohol free) 25mg every 6 hours as needed Tylenol PM Unisom, no Gelcaps  Leg Cramps: Tums MagGel  Nausea/Vomiting:  Bonine Dramamine Emetrol Ginger extract Sea bands Meclizine  Nausea medication to take during pregnancy:  Unisom (doxylamine succinate 25 mg tablets) Take one tablet daily at bedtime. If symptoms are not adequately controlled, the dose can be increased to a maximum recommended dose of two tablets daily (1/2 tablet in the morning, 1/2 tablet mid-afternoon and one at bedtime). Vitamin B6 100mg tablets. Take one tablet twice a day (up to 200 mg per day).  Skin Rashes: Aveeno products Benadryl cream or 25mg every 6 hours as needed Calamine Lotion 1% cortisone cream  Yeast infection: Gyne-lotrimin 7 Monistat 7  Gum/tooth pain: Anbesol  **If taking multiple medications, please check labels to avoid duplicating the same active ingredients **take medication as directed on the label ** Do not exceed 4000 mg of tylenol in 24 hours **Do not take medications that contain aspirin or ibuprofen    

## 2015-08-15 NOTE — Progress Notes (Signed)
  Subjective:    Penny Cherry is being seen today for her first obstetrical visit.  This is a planned pregnancy. She is at [redacted]w[redacted]d gestation. Her obstetrical history is significant for nothing. . Relationship with FOB: significant other, not living together. Patient does intend to breast feed. Pregnancy history fully reviewed.  Patient reports nausea.  Review of Systems:   Review of Systems Review of Systems - History obtained from the patient General ROS: negative Respiratory ROS: negative Cardiovascular ROS: negative Gastrointestinal ROS: positive for - nausea/vomiting negative for - abdominal pain or change in bowel habits Genito-Urinary ROS: negative for - urinary frequency/urgency, vulvar/vaginal symptoms or vaginal bleeding  Objective:    FHT 166 by doppler BP 104/66 mmHg  Pulse 78  Wt 140 lb 14.4 oz (63.912 kg)  LMP 05/26/2015 (Approximate) Physical Exam  Exam Physical Examination: General appearance - alert, well appearing, and in no distress, oriented to person, place, and time and normal appearing weight Mental status - alert, oriented to person, place, and time Neck - supple, no significant adenopathy Lymphatics - no palpable lymphadenopathy Chest - clear to auscultation, no wheezes, rales or rhonchi, symmetric air entry Heart - normal rate, regular rhythm, normal S1, S2, no murmurs, rubs, clicks or gallops Abdomen - soft, nontender, nondistended, no masses or organomegaly Pelvic - normal external genitalia, vulva, vagina, cervix, uterus and adnexa, VULVA: normal appearing vulva with no masses, tenderness or lesions, PAP: Pap smear done today. Cervix closed. Small amount of white creamy discharge Neurological - alert, oriented, normal speech, no focal findings or movement disorder noted Musculoskeletal - full range of motion without pain    Assessment:    Pregnancy: [id number] Patient Active Problem List   Diagnosis Date Noted  . Supervision of normal pregnancy in  first trimester 08/15/2015       Plan:  1. Supervision of normal pregnancy in first trimester  - Prenatal Profile - Culture, OB Urine - Prescript Monitor Profile(19) - Cytology - PAP - GC/Chlamydia probe amp ([place])not at [facility] - promethazine (PHENERGAN) 25 MG tablet; Take 1 tablet (25 mg total) by mouth every 6 (six) hours as needed for nausea or vomiting.  Dispense: 30 tablet; Refill: 1 - ondansetron (ZOFRAN ODT) 8 MG disintegrating tablet; Take 1 tablet (8 mg total) by mouth every 8 (eight) hours as needed for nausea or vomiting.  Dispense: 20 tablet; Refill: 0  2. Nausea/vomiting in pregnancy  - promethazine (PHENERGAN) 25 MG tablet; Take 1 tablet (25 mg total) by mouth every 6 (six) hours as needed for nausea or vomiting.  Dispense: 30 tablet; Refill: 1 - ondansetron (ZOFRAN ODT) 8 MG disintegrating tablet; Take 1 tablet (8 mg total) by mouth every 8 (eight) hours as needed for nausea or vomiting.  Dispense: 20 tablet; Refill: 0    Initial labs drawn. Prenatal vitamins. Problem list reviewed and updated. Follow up in 4 weeks.    Judeth Horn 08/15/2015

## 2015-08-15 NOTE — Progress Notes (Signed)
Declined flu vaccine

## 2015-08-16 LAB — PRESCRIPTION MONITORING PROFILE (19 PANEL)
Amphetamine/Meth: NEGATIVE ng/mL
Barbiturate Screen, Urine: NEGATIVE ng/mL
Benzodiazepine Screen, Urine: NEGATIVE ng/mL
Buprenorphine, Urine: NEGATIVE ng/mL
CARISOPRODOL, URINE: NEGATIVE ng/mL
COCAINE METABOLITES: NEGATIVE ng/mL
CREATININE, URINE: 255.63 mg/dL (ref >20.0)
Cannabinoid Scrn, Ur: NEGATIVE ng/mL
ECSTASY: NEGATIVE ng/mL
FENTANYL URINE: NEGATIVE ng/mL
Meperidine, Ur: NEGATIVE ng/mL
Methadone Screen, Urine: NEGATIVE ng/mL
Methaqualone: NEGATIVE ng/mL
NITRITES URINE, INITIAL: NEGATIVE ug/mL
OXYCODONE SCRN UR: NEGATIVE ng/mL
Opiate Screen, Urine: NEGATIVE ng/mL
PH URINE, INITIAL: 7.9 pH (ref 4.5–8.9)
PROPOXYPHENE: NEGATIVE ng/mL
Phencyclidine, Ur: NEGATIVE ng/mL
TRAMADOL UR: NEGATIVE ng/mL
Tapentadol, urine: NEGATIVE ng/mL
ZOLPIDEM, URINE: NEGATIVE ng/mL

## 2015-08-16 LAB — CULTURE, OB URINE
Colony Count: NO GROWTH
Organism ID, Bacteria: NO GROWTH

## 2015-08-16 LAB — PRENATAL PROFILE (SOLSTAS)
ANTIBODY SCREEN: NEGATIVE
BASOS ABS: 0 10*3/uL (ref 0.0–0.1)
Basophils Relative: 0 % (ref 0–1)
Eosinophils Absolute: 0.1 10*3/uL (ref 0.0–0.7)
Eosinophils Relative: 2 % (ref 0–5)
HEMATOCRIT: 37.3 % (ref 36.0–46.0)
HIV: NONREACTIVE
Hemoglobin: 12.5 g/dL (ref 12.0–15.0)
Hepatitis B Surface Ag: NEGATIVE
LYMPHS ABS: 1.6 10*3/uL (ref 0.7–4.0)
LYMPHS PCT: 23 % (ref 12–46)
MCH: 31.3 pg (ref 26.0–34.0)
MCHC: 33.5 g/dL (ref 30.0–36.0)
MCV: 93.3 fL (ref 78.0–100.0)
MPV: 9.3 fL (ref 8.6–12.4)
Monocytes Absolute: 0.4 10*3/uL (ref 0.1–1.0)
Monocytes Relative: 6 % (ref 3–12)
NEUTROS PCT: 69 % (ref 43–77)
Neutro Abs: 4.9 10*3/uL (ref 1.7–7.7)
PLATELETS: 284 10*3/uL (ref 150–400)
RBC: 4 MIL/uL (ref 3.87–5.11)
RDW: 13.5 % (ref 11.5–15.5)
Rh Type: POSITIVE
Rubella: 16.7 Index — ABNORMAL HIGH (ref <0.90)
WBC: 7.1 10*3/uL (ref 4.0–10.5)

## 2015-08-16 LAB — GC/CHLAMYDIA PROBE AMP (~~LOC~~) NOT AT ARMC
CHLAMYDIA, DNA PROBE: NEGATIVE
NEISSERIA GONORRHEA: NEGATIVE

## 2015-08-17 LAB — CYTOLOGY - PAP

## 2015-09-12 ENCOUNTER — Encounter: Payer: Medicaid Other | Admitting: Advanced Practice Midwife

## 2016-05-17 ENCOUNTER — Encounter (HOSPITAL_COMMUNITY): Payer: Self-pay

## 2016-07-30 IMAGING — US US OB COMP LESS 14 WK
1 series · 14 of 28 positions shown · non-contrast
Comparison: None.

CLINICAL DATA: Spotting for 1 week.

EXAM:
OBSTETRIC <14 WK US AND TRANSVAGINAL OB US
TECHNIQUE: Both transabdominal and transvaginal ultrasound examinations were
performed for complete evaluation of the gestation as well as the
maternal uterus, adnexal regions, and pelvic cul-de-sac.
Transvaginal technique was performed to assess early pregnancy.

[Series 1: us ob follow up · 31 acquisitions, 14 frames shown]
[im 2/31]
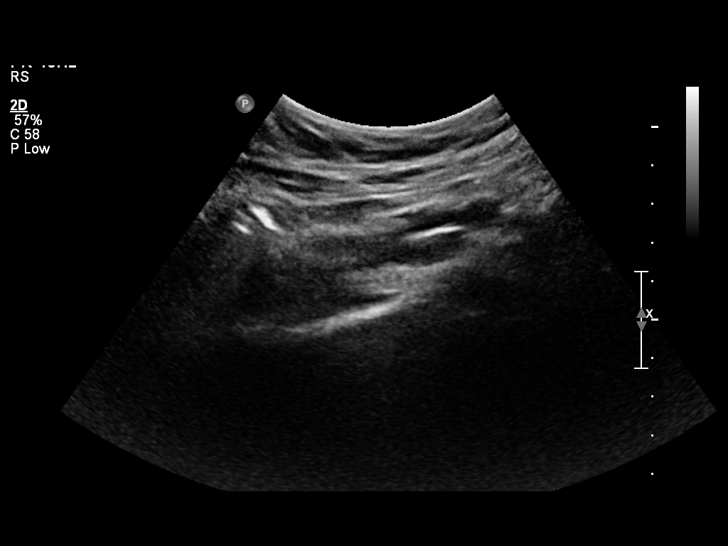
[im 4/31]
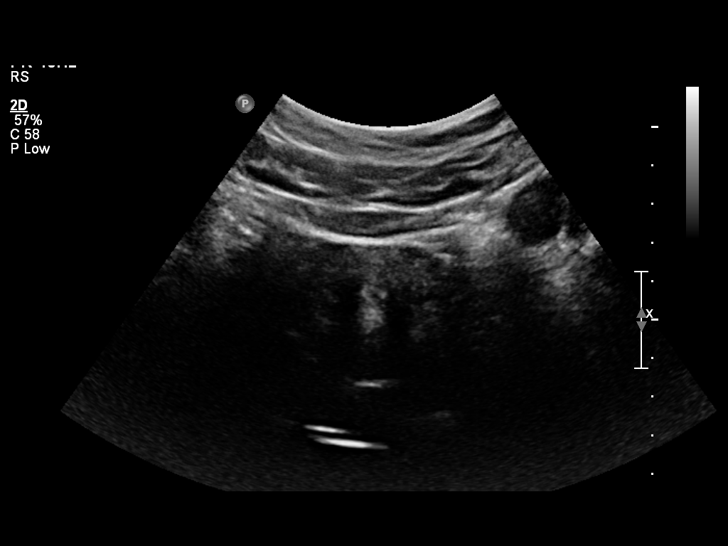
[im 6/31]
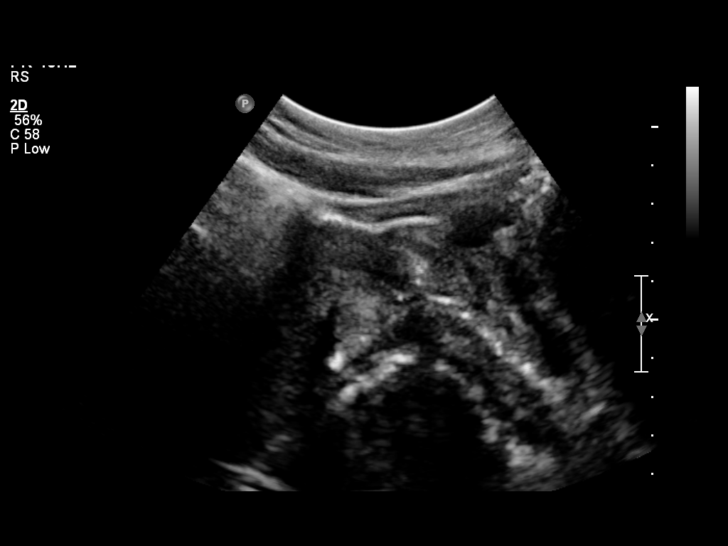
[im 8/31]
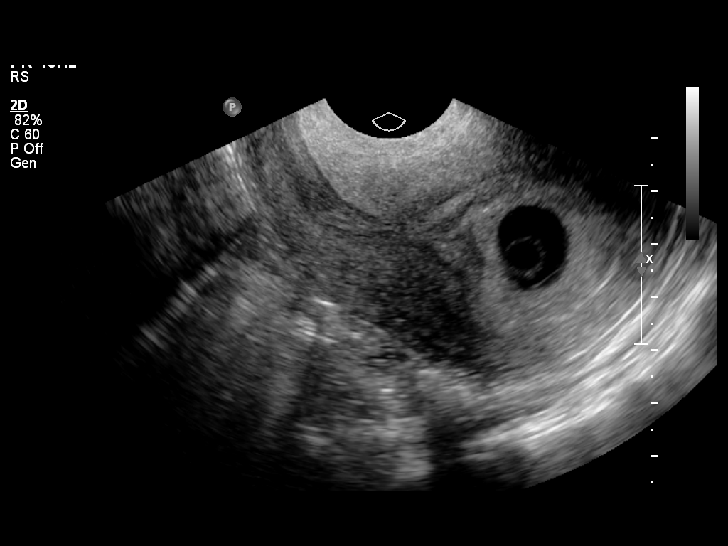
[im 11/31]
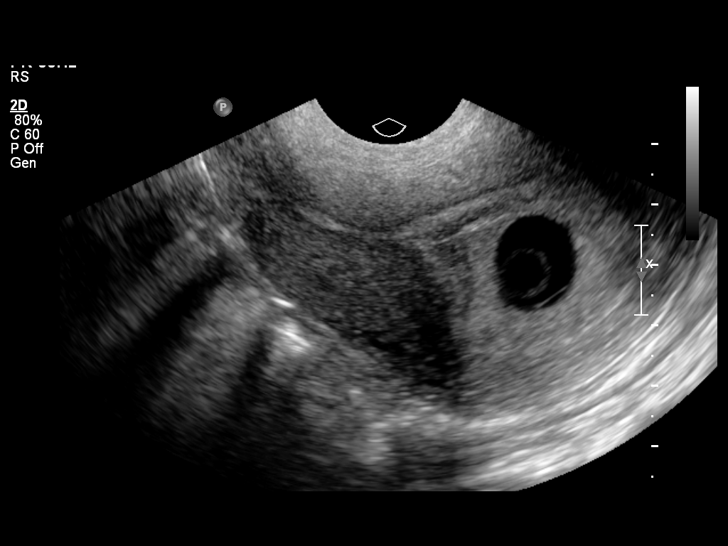
[im 13/31]
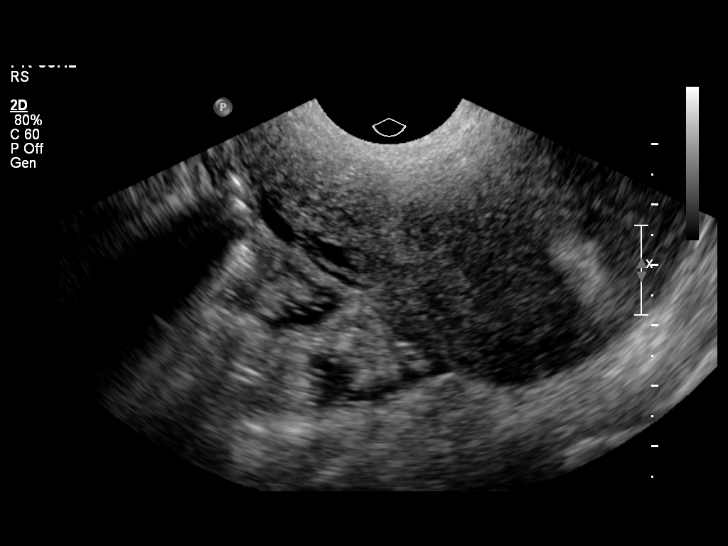
[im 15/31]
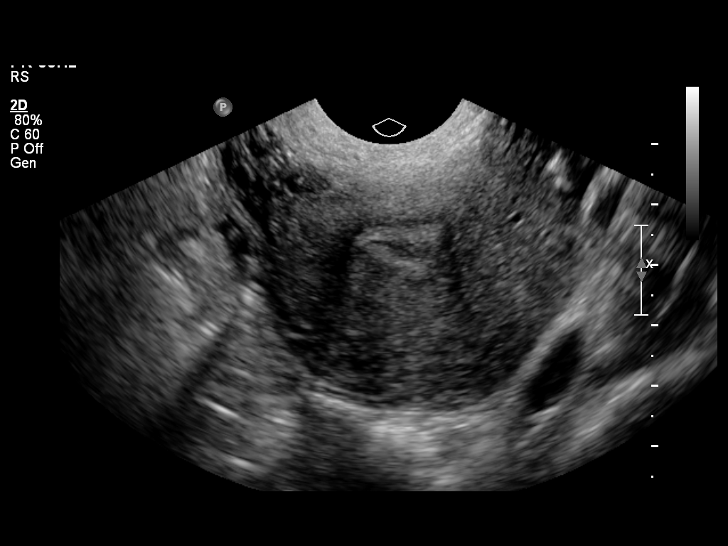
[im 17/31]
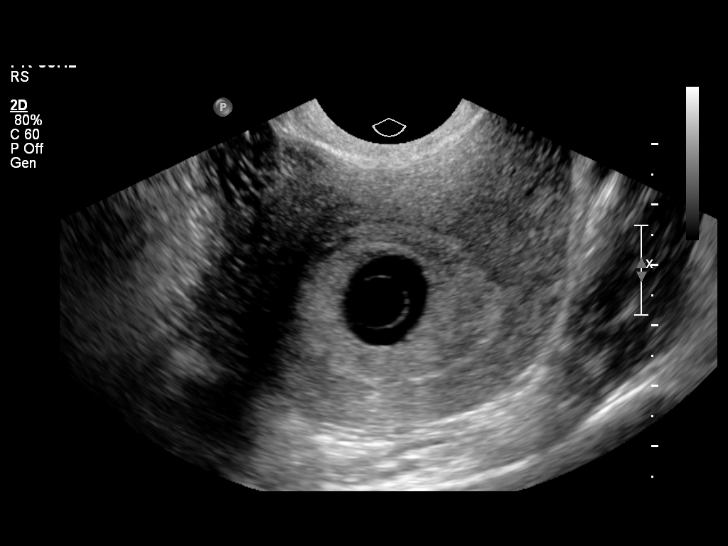
[im 19/31]
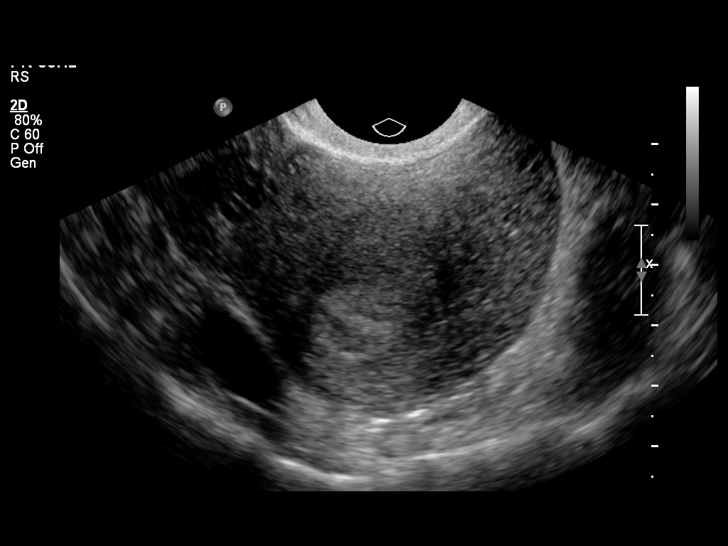
[im 22/31]
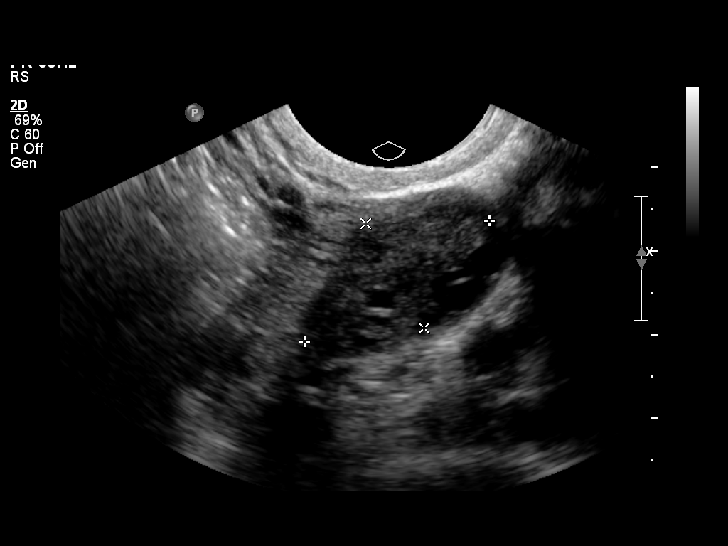
[im 24/31]
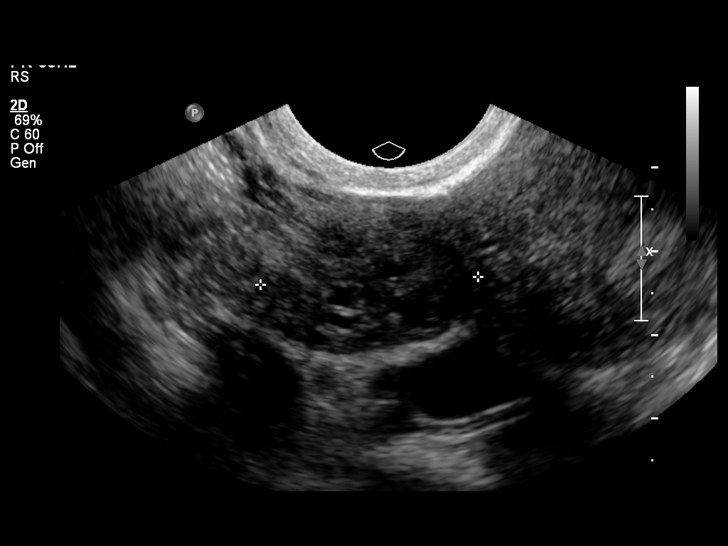
[im 26/31]
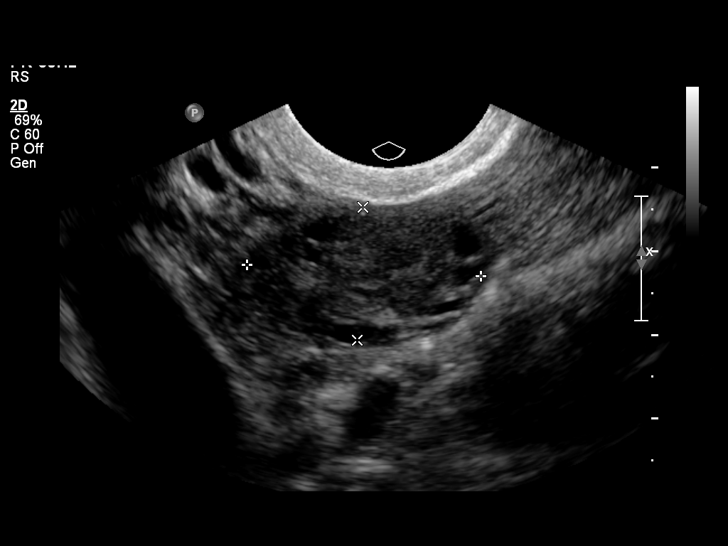
[im 28/31]
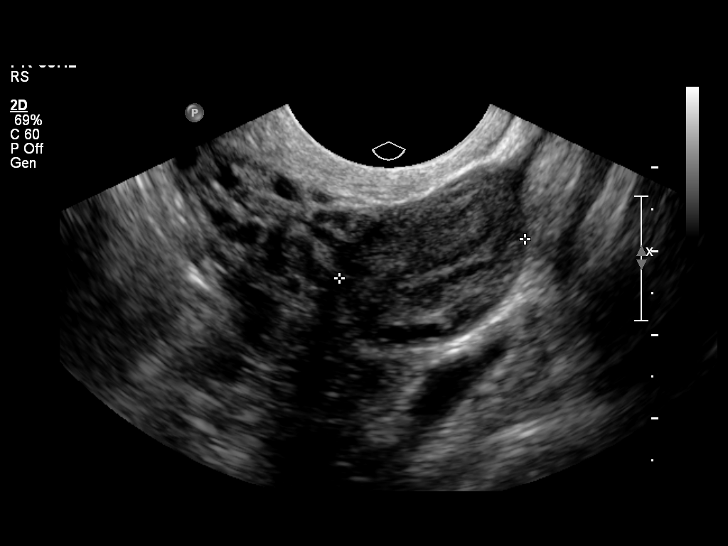
[im 31/31]
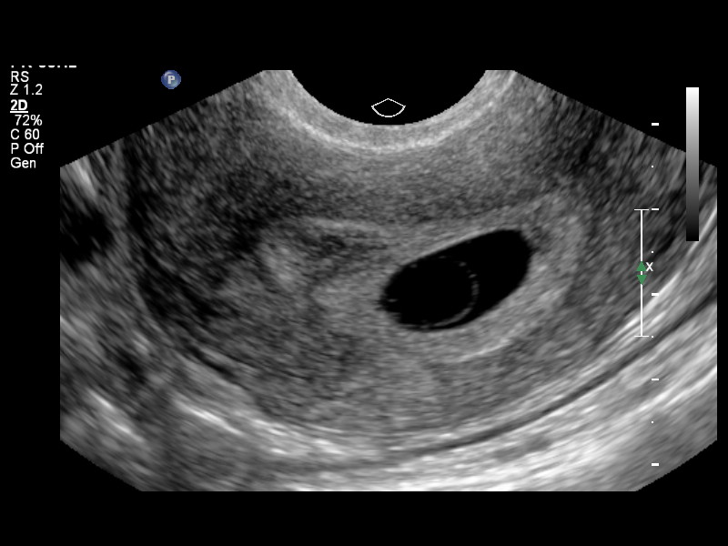

[14 of 28 positions shown; findings below may reference images not displayed]

FINDINGS: Intrauterine gestational sac: Single

Yolk sac:  Yes, measures 9.8 mm

Embryo:  No

Cardiac Activity: No

Heart Rate: Not applicable  bpm

MSD: 16.2  mm   6 w   4  d

Maternal uterus/adnexae:

Subchorionic hemorrhage: Small subchorionic hemorrhage noted.

Right ovary: Normal

Left ovary: Normal

Other :None

Free fluid:  None
IMPRESSION: 1. There is a single intrauterine gestational sac which contains an
enlarged yolk sac without embryo. Findings are suspicious but not
yet definitive for failed pregnancy. Recommend follow-up US in 10-14
days for definitive diagnosis. This recommendation follows SRU
consensus guidelines: Diagnostic Criteria for Nonviable Pregnancy
Early in the First Trimester. N Engl J Med 5346; [DATE].
2. Small subchorionic hemorrhage.

## 2016-08-09 IMAGING — US US OB TRANSVAGINAL
1 series · 15 of 28 positions shown · non-contrast
Comparison: 03/13/2015.

CLINICAL DATA: Threatened miscarriage. Vaginal bleeding. Follow-up
ultrasound.

EXAM:
TRANSVAGINAL OB ULTRASOUND
TECHNIQUE: Transvaginal ultrasound was performed for complete evaluation of the
gestation as well as the maternal uterus, adnexal regions, and
pelvic cul-de-sac.

[Series 1: us ob transvaginal · 57 acquisitions, 15 frames shown]
[im 1/57]
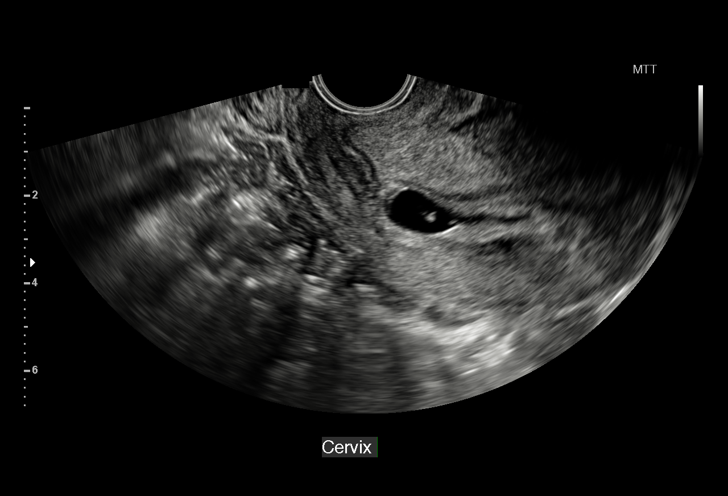
[im 5/57]
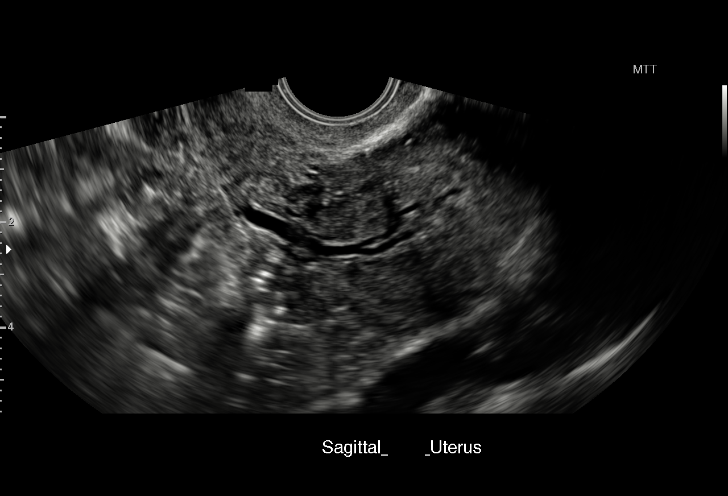
[im 9/57]
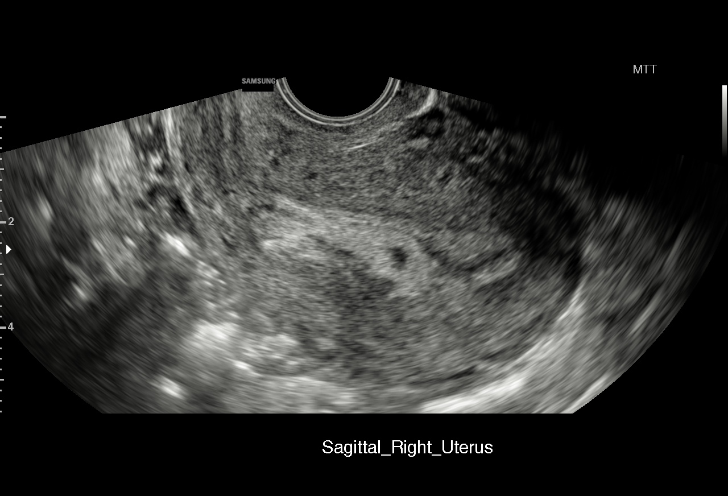
[im 13/57]
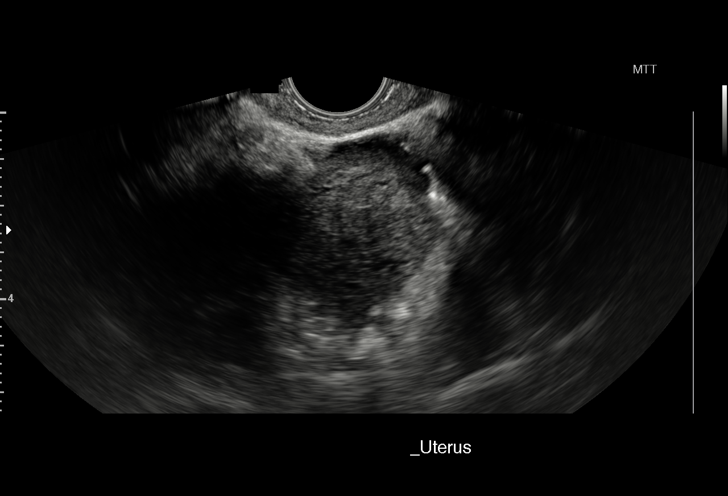
[im 17/57]
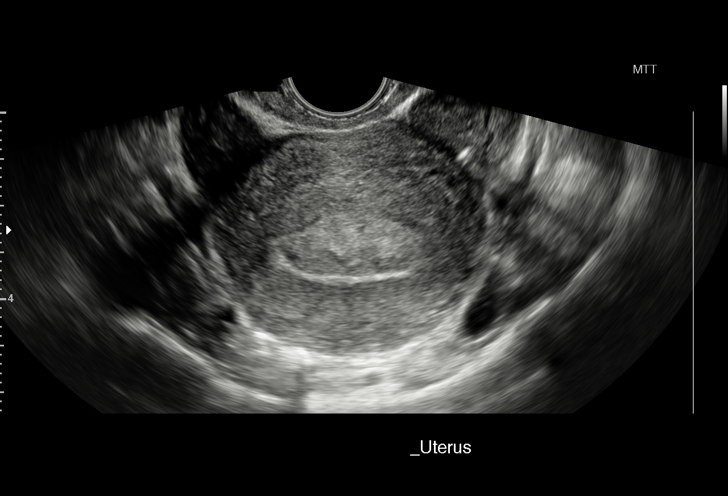
[im 21/57]
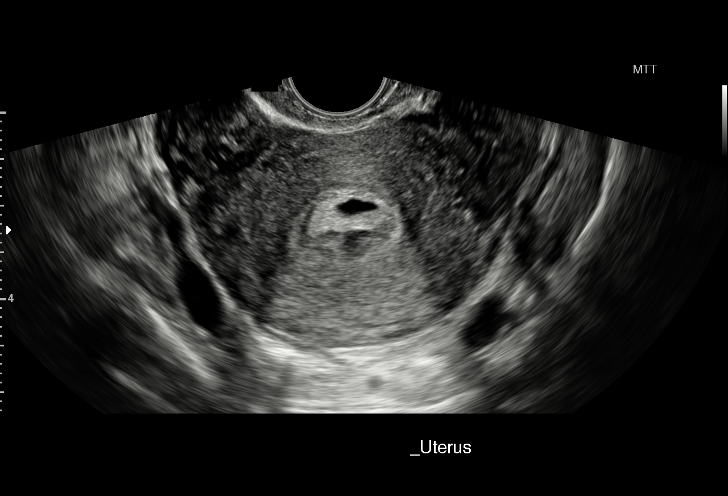
[im 25/57]
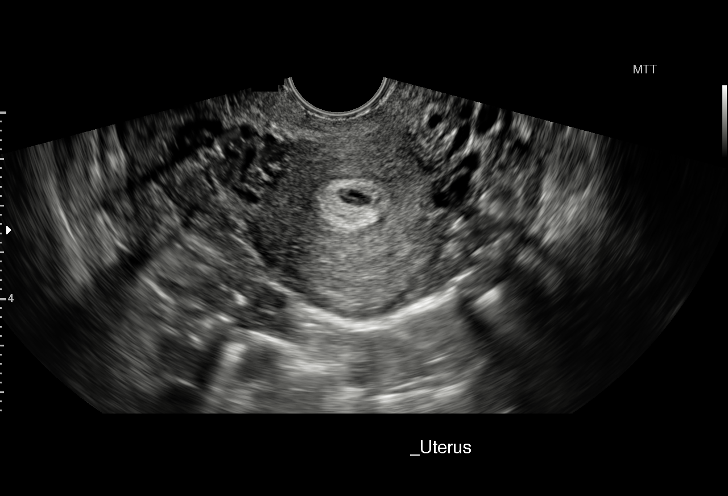
[im 30/57]
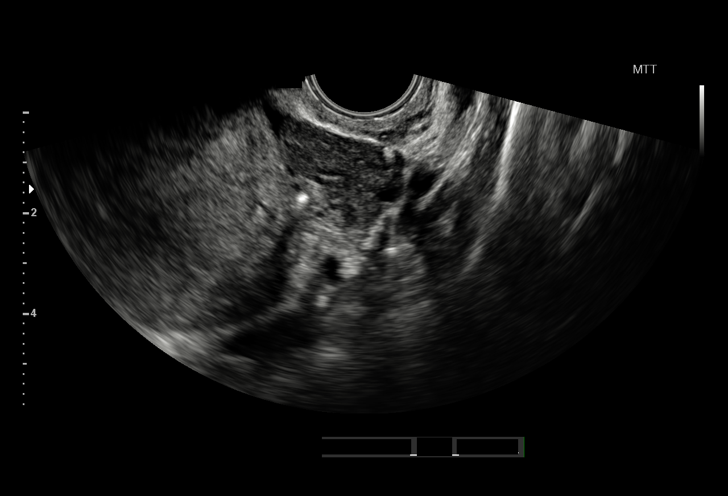
[im 32/57]
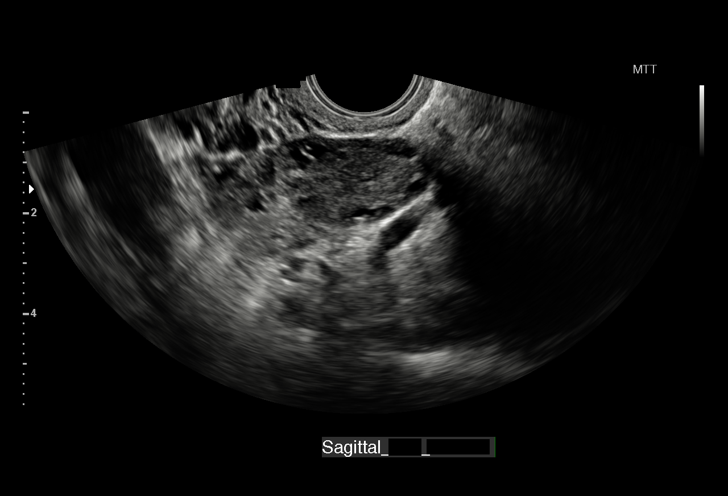
[im 36/57]
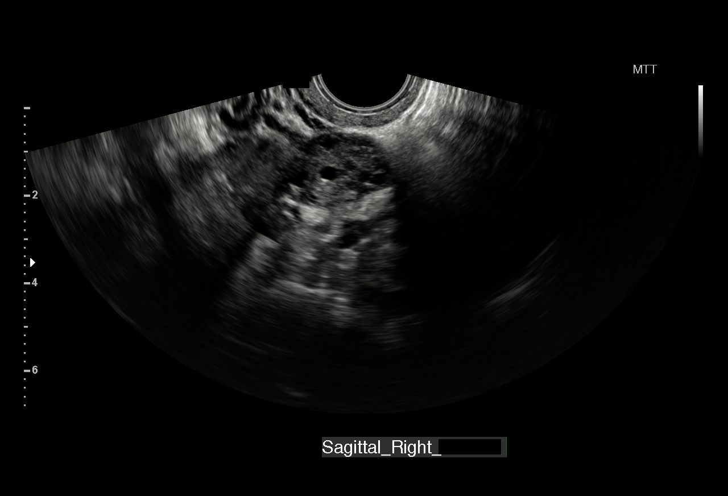
[im 40/57]
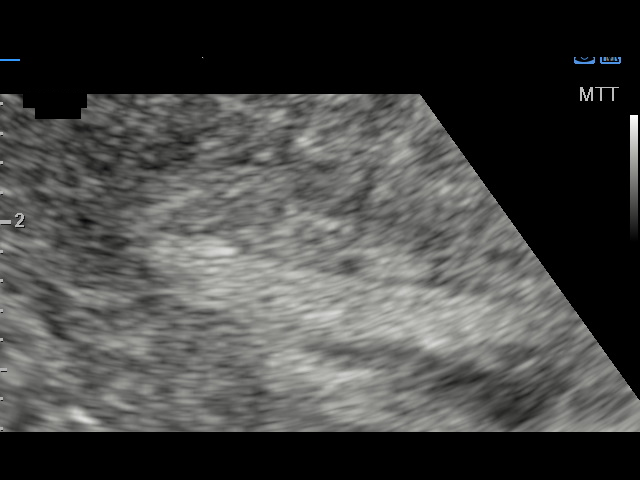
[im 44/57]
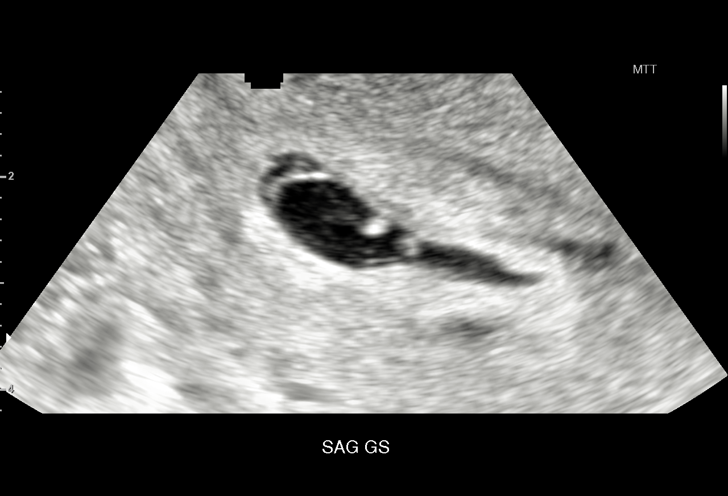
[im 48/57]
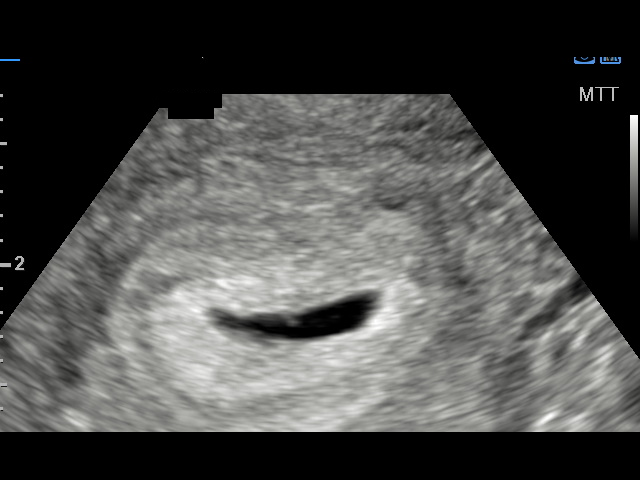
[im 52/57]
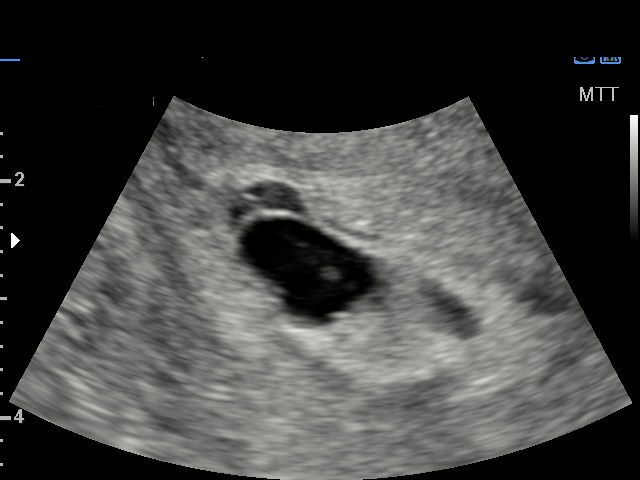
[im 57/57]
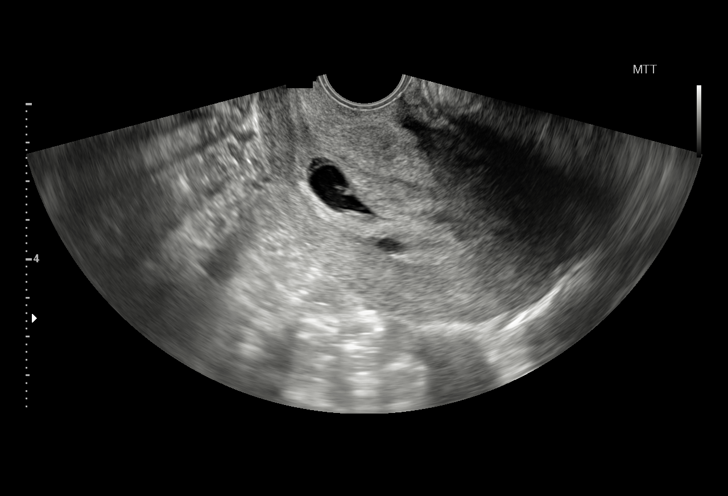

[15 of 28 positions shown; findings below may reference images not displayed]

FINDINGS: Intrauterine gestational sac: A single intrauterine gestational sac
is present. The gestational sac is now a elongated and extends into
the lower uterine segment.

Yolk sac:  No longer visible.

Embryo: A focal echogenic structure is measured at 3.3 mm. This was
not present previously. It could represent a small fetal pole versus
debris.

Cardiac Activity: None present

CRL:   3.3  mm   6 w 0 d

Maternal uterus/adnexae: A moderate-sized subchorionic hemorrhage is
noted. This has increased since the prior exam.
IMPRESSION: 1. Distortion of the previously-seen intrauterine gestational sac
with extension into the lower uterine segment.
2. The previously noted yolk sac is no longer visible.
3. Fetal pole versus debris measures 3.3 mm. This would correspond
with a 6 week 0 day fetus. No heartbeat is visible.
Findings are suspicious but not yet definitive for failed pregnancy.
Recommend follow-up US in 10-14 days for definitive diagnosis. This
recommendation follows SRU consensus guidelines: Diagnostic Criteria
for Nonviable Pregnancy Early in the First Trimester. N Engl J Med
